# Patient Record
Sex: Male | Born: 1960 | Race: White | Hispanic: No | Smoking: Never smoker
Health system: Southern US, Community
[De-identification: ages and names within clinical notes are randomized; demographics above are authoritative.]

## PROBLEM LIST (undated history)

## (undated) DIAGNOSIS — T7840XA Allergy, unspecified, initial encounter: Secondary | ICD-10-CM

## (undated) DIAGNOSIS — Z86010 Personal history of colonic polyps: Secondary | ICD-10-CM

## (undated) DIAGNOSIS — I10 Essential (primary) hypertension: Secondary | ICD-10-CM

## (undated) DIAGNOSIS — B1910 Unspecified viral hepatitis B without hepatic coma: Secondary | ICD-10-CM

## (undated) DIAGNOSIS — E785 Hyperlipidemia, unspecified: Secondary | ICD-10-CM

## (undated) DIAGNOSIS — E079 Disorder of thyroid, unspecified: Secondary | ICD-10-CM

## (undated) HISTORY — DX: Essential (primary) hypertension: I10

## (undated) HISTORY — DX: Disorder of thyroid, unspecified: E07.9

## (undated) HISTORY — PX: POLYPECTOMY: SHX149

## (undated) HISTORY — DX: Hyperlipidemia, unspecified: E78.5

## (undated) HISTORY — DX: Personal history of colonic polyps: Z86.010

## (undated) HISTORY — DX: Unspecified viral hepatitis B without hepatic coma: B19.10

## (undated) HISTORY — PX: TONSILLECTOMY: SUR1361

## (undated) HISTORY — DX: Allergy, unspecified, initial encounter: T78.40XA

---
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
## 2012-03-17 DIAGNOSIS — Z86010 Personal history of colonic polyps: Secondary | ICD-10-CM

## 2012-03-17 DIAGNOSIS — Z860101 Personal history of adenomatous and serrated colon polyps: Secondary | ICD-10-CM

## 2012-03-17 HISTORY — PX: COLONOSCOPY: SHX174

## 2012-03-17 HISTORY — DX: Personal history of colonic polyps: Z86.010

## 2012-03-17 HISTORY — DX: Personal history of adenomatous and serrated colon polyps: Z86.0101

## 2017-01-06 ENCOUNTER — Encounter: Payer: Self-pay | Admitting: Family Medicine

## 2017-01-06 ENCOUNTER — Ambulatory Visit (INDEPENDENT_AMBULATORY_CARE_PROVIDER_SITE_OTHER): Payer: 59 | Admitting: Family Medicine

## 2017-01-06 VITALS — BP 128/88 | HR 63 | Temp 97.9°F | Ht 74.0 in | Wt 207.0 lb

## 2017-01-06 DIAGNOSIS — E785 Hyperlipidemia, unspecified: Secondary | ICD-10-CM | POA: Diagnosis not present

## 2017-01-06 DIAGNOSIS — Z23 Encounter for immunization: Secondary | ICD-10-CM | POA: Diagnosis not present

## 2017-01-06 NOTE — Patient Instructions (Signed)
WE NOW OFFER   [facility] FAST TRACK!!!  SAME DAY Appointments for ACUTE CARE  Such as: Sprains, Injuries, cuts, abrasions, rashes, muscle pain, joint pain, back pain Colds, flu, sore throats, headache, allergies, cough, fever  Ear pain, sinus and eye infections Abdominal pain, nausea, vomiting, diarrhea, upset stomach Animal/insect bites  3 Easy Ways to Schedule: Walk-In Scheduling Call in scheduling Mychart Sign-up: [email or web address]         

## 2017-01-06 NOTE — Addendum Note (Signed)
Addended by: Aggie Hacker A on: 01/06/2017 12:20 PM   Modules accepted: Orders

## 2017-01-06 NOTE — Progress Notes (Signed)
   Subjective:    Patient ID: Samuel Castillo, male    DOB: Mar 10, 1961, 56 y.o.   MRN: [id number]  HPI 56 yr old male to establish with [place]. He and his family moved to [place] in April from [place]. 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 He feels great and has no concerns. His last well exam was about a year and a half ago. He lives with his wife. Their 3 children are either in college or working on their own.    Review of Systems  Constitutional: Negative.   Respiratory: Negative.   Cardiovascular: Negative.   Gastrointestinal: Negative.   Genitourinary: Negative.   Neurological: Negative.        Objective:   Physical Exam  Constitutional: He appears well-developed and well-nourished.  Neck: No thyromegaly present.  Cardiovascular: Normal rate, regular rhythm, normal heart sounds and intact distal pulses.   Pulmonary/Chest: Effort normal and breath sounds normal. No respiratory distress. He has no wheezes. He has no rales.  Lymphadenopathy:    He has no cervical adenopathy.          Assessment & Plan:  Introductory visit for this healthy gentleman. He has a hx of borderline lipids and he watches his diet closely. He will return soon for a well exam and fasting labs.  Alysia Penna, MD

## 2017-12-04 ENCOUNTER — Encounter: Payer: Self-pay | Admitting: Family Medicine

## 2017-12-04 ENCOUNTER — Ambulatory Visit (INDEPENDENT_AMBULATORY_CARE_PROVIDER_SITE_OTHER): Payer: 59 | Admitting: Family Medicine

## 2017-12-04 VITALS — BP 120/92 | HR 71 | Temp 98.1°F | Ht 74.0 in | Wt 212.2 lb

## 2017-12-04 DIAGNOSIS — Z23 Encounter for immunization: Secondary | ICD-10-CM | POA: Diagnosis not present

## 2017-12-04 DIAGNOSIS — Z0000 Encounter for general adult medical examination without abnormal findings: Secondary | ICD-10-CM | POA: Diagnosis not present

## 2017-12-04 DIAGNOSIS — R739 Hyperglycemia, unspecified: Secondary | ICD-10-CM | POA: Diagnosis not present

## 2017-12-04 DIAGNOSIS — Z125 Encounter for screening for malignant neoplasm of prostate: Secondary | ICD-10-CM | POA: Diagnosis not present

## 2017-12-04 LAB — CBC WITH DIFFERENTIAL/PLATELET
BASOS ABS: 0 10*3/uL (ref 0.0–0.1)
BASOS PCT: 0.8 % (ref 0.0–3.0)
EOS ABS: 0.2 10*3/uL (ref 0.0–0.7)
Eosinophils Relative: 3.1 % (ref 0.0–5.0)
HEMATOCRIT: 44.9 % (ref 39.0–52.0)
HEMOGLOBIN: 15.8 g/dL (ref 13.0–17.0)
LYMPHS PCT: 19.3 % (ref 12.0–46.0)
Lymphs Abs: 1.2 10*3/uL (ref 0.7–4.0)
MCHC: 35.2 g/dL (ref 30.0–36.0)
MCV: 84.9 fl (ref 78.0–100.0)
MONOS PCT: 7.6 % (ref 3.0–12.0)
Monocytes Absolute: 0.5 10*3/uL (ref 0.1–1.0)
NEUTROS ABS: 4.3 10*3/uL (ref 1.4–7.7)
Neutrophils Relative %: 69.2 % (ref 43.0–77.0)
PLATELETS: 148 10*3/uL — AB (ref 150.0–400.0)
RBC: 5.29 Mil/uL (ref 4.22–5.81)
RDW: 12.9 % (ref 11.5–15.5)
WBC: 6.2 10*3/uL (ref 4.0–10.5)

## 2017-12-04 LAB — TSH: TSH: 4.62 u[IU]/mL — ABNORMAL HIGH (ref 0.35–4.50)

## 2017-12-04 LAB — HEPATIC FUNCTION PANEL
ALK PHOS: 44 U/L (ref 39–117)
ALT: 16 U/L (ref 0–53)
AST: 22 U/L (ref 0–37)
Albumin: 4.7 g/dL (ref 3.5–5.2)
BILIRUBIN DIRECT: 0.3 mg/dL (ref 0.0–0.3)
BILIRUBIN TOTAL: 1.6 mg/dL — AB (ref 0.2–1.2)
Total Protein: 7 g/dL (ref 6.0–8.3)

## 2017-12-04 LAB — POC URINALSYSI DIPSTICK (AUTOMATED)
BILIRUBIN UA: NEGATIVE
GLUCOSE UA: NEGATIVE
LEUKOCYTES UA: NEGATIVE
NITRITE UA: NEGATIVE
Protein, UA: NEGATIVE
RBC UA: NEGATIVE
Spec Grav, UA: 1.02 (ref 1.010–1.025)
UROBILINOGEN UA: 0.2 U/dL
pH, UA: 6 (ref 5.0–8.0)

## 2017-12-04 LAB — LIPID PANEL
CHOL/HDL RATIO: 5
Cholesterol: 232 mg/dL — ABNORMAL HIGH (ref 0–200)
HDL: 50.1 mg/dL (ref >39.00)
LDL CALC: 166 mg/dL — AB (ref 0–99)
NONHDL: 181.89
TRIGLYCERIDES: 78 mg/dL (ref 0.0–149.0)
VLDL: 15.6 mg/dL (ref 0.0–40.0)

## 2017-12-04 LAB — BASIC METABOLIC PANEL
BUN: 19 mg/dL (ref 6–23)
CHLORIDE: 99 meq/L (ref 96–112)
CO2: 33 mEq/L — ABNORMAL HIGH (ref 19–32)
Calcium: 9.8 mg/dL (ref 8.4–10.5)
Creatinine, Ser: 1.16 mg/dL (ref 0.40–1.50)
GFR: 68.41 mL/min (ref >60.00)
Glucose, Bld: 85 mg/dL (ref 70–99)
POTASSIUM: 3.7 meq/L (ref 3.5–5.1)
SODIUM: 138 meq/L (ref 135–145)

## 2017-12-04 LAB — HEMOGLOBIN A1C: Hgb A1c MFr Bld: 5.5 % (ref 4.6–6.5)

## 2017-12-04 LAB — PSA: PSA: 0.67 ng/mL (ref 0.10–4.00)

## 2017-12-04 MED ORDER — TERBINAFINE HCL 250 MG PO TABS: 250.0000 mg | ORAL_TABLET | Freq: Every day | ORAL | 1 refills | Status: DC

## 2017-12-04 NOTE — Progress Notes (Signed)
   Subjective:    Patient ID: Samuel Castillo, male    DOB: 07-15-1960, 57 y.o.   MRN: [id number]  HPI Here for a well exam. He feels great. He works out at [employer] 3 days a week. He does ask about toenail fungus. This appeared several years ago and OTC creams do not help.    Review of Systems  Constitutional: Negative.   HENT: Negative.   Eyes: Negative.   Respiratory: Negative.   Cardiovascular: Negative.   Gastrointestinal: Negative.   Genitourinary: Negative.   Musculoskeletal: Negative.   Skin: Negative.   Neurological: Negative.   Psychiatric/Behavioral: Negative.        Objective:   Physical Exam  Constitutional: He is oriented to person, place, and time. He appears well-developed and well-nourished. No distress.  HENT:  Head: Normocephalic and atraumatic.  Right Ear: External ear normal.  Left Ear: External ear normal.  Nose: Nose normal.  Mouth/Throat: Oropharynx is clear and moist. No oropharyngeal exudate.  Eyes: Pupils are equal, round, and reactive to light. Conjunctivae and EOM are normal. Right eye exhibits no discharge. Left eye exhibits no discharge. No scleral icterus.  Neck: Neck supple. No JVD present. No tracheal deviation present. No thyromegaly present.  Cardiovascular: Normal rate, regular rhythm, normal heart sounds and intact distal pulses. Exam reveals no gallop and no friction rub.  No murmur heard. Pulmonary/Chest: Effort normal and breath sounds normal. No respiratory distress. He has no wheezes. He has no rales. He exhibits no tenderness.  Abdominal: Soft. Bowel sounds are normal. He exhibits no distension and no mass. There is no tenderness. There is no rebound and no guarding.  Genitourinary: Rectum normal, prostate normal and penis normal. Rectal exam shows guaiac negative stool. No penile tenderness.  Musculoskeletal: Normal range of motion. He exhibits no edema or tenderness.  Lymphadenopathy:    He has no cervical adenopathy.  Neurological:  He is alert and oriented to person, place, and time. He has normal reflexes. He displays normal reflexes. No cranial nerve deficit. He exhibits normal muscle tone. Coordination normal.  Skin: Skin is warm and dry. No rash noted. He is not diaphoretic. No erythema. No pallor.  8 out of 10 toenails show fungal involvement   Psychiatric: He has a normal mood and affect. His behavior is normal. Judgment and thought content normal.          Assessment & Plan:  Well exam. We discussed diet and exercise. Get fasting labs. Treat the onychomycosis with Terbinafine. Set up a colonoscopy.  Alysia Penna, MD

## 2017-12-17 ENCOUNTER — Encounter: Payer: Self-pay | Admitting: Family Medicine

## 2017-12-18 NOTE — Telephone Encounter (Signed)
FYI for Dr. Sarajane Jews.  Please advise. Thanks

## 2017-12-21 NOTE — Telephone Encounter (Signed)
Noted  

## 2018-02-15 ENCOUNTER — Encounter: Payer: Self-pay | Admitting: Family Medicine

## 2018-02-25 ENCOUNTER — Telehealth: Payer: Self-pay | Admitting: Gastroenterology

## 2018-02-25 NOTE — Telephone Encounter (Signed)
Patient was referred for colonoscopy but had colon 5 yrs ago please review and advised. Colon report and path avail.  Placed on Dr [employer] desk

## 2018-03-19 ENCOUNTER — Encounter: Payer: Self-pay | Admitting: Gastroenterology

## 2018-03-19 NOTE — Telephone Encounter (Signed)
Records reviewed and Dr. Rush Landmark agrees with scheduling Direct Colon now. I have called Samuel Castillo name pronouced "Check" per pt. We scheduled his procedure for 04-15-2017.

## 2018-04-02 ENCOUNTER — Encounter: Payer: Self-pay | Admitting: Gastroenterology

## 2018-04-02 ENCOUNTER — Ambulatory Visit (AMBULATORY_SURGERY_CENTER): Payer: Self-pay

## 2018-04-02 VITALS — Ht 73.5 in | Wt 212.8 lb

## 2018-04-02 DIAGNOSIS — Z860101 Personal history of adenomatous and serrated colon polyps: Secondary | ICD-10-CM

## 2018-04-02 DIAGNOSIS — Z86010 Personal history of colonic polyps: Secondary | ICD-10-CM

## 2018-04-02 MED ORDER — PEG 3350-KCL-NA BICARB-NACL 420 G PO SOLR: 4000.0000 mL | Freq: Once | ORAL | 0 refills | Status: AC

## 2018-04-02 NOTE — Progress Notes (Signed)
Per pt, no allergies to soy or egg products.Pt not taking any weight loss meds or using  O2 at home.  Pt refused emmi video. 

## 2018-04-15 ENCOUNTER — Encounter: Payer: Self-pay | Admitting: Gastroenterology

## 2018-04-15 ENCOUNTER — Ambulatory Visit (AMBULATORY_SURGERY_CENTER): Payer: 59 | Admitting: Gastroenterology

## 2018-04-15 VITALS — BP 111/68 | HR 60 | Temp 98.6°F | Resp 10 | Ht 73.0 in | Wt 212.0 lb

## 2018-04-15 DIAGNOSIS — Z860100 Personal history of colon polyps, unspecified: Secondary | ICD-10-CM

## 2018-04-15 DIAGNOSIS — D122 Benign neoplasm of ascending colon: Secondary | ICD-10-CM

## 2018-04-15 DIAGNOSIS — D123 Benign neoplasm of transverse colon: Secondary | ICD-10-CM

## 2018-04-15 DIAGNOSIS — D125 Benign neoplasm of sigmoid colon: Secondary | ICD-10-CM | POA: Diagnosis not present

## 2018-04-15 HISTORY — PX: COLONOSCOPY: SHX174

## 2018-04-15 MED ORDER — SODIUM CHLORIDE 0.9 % IV SOLN: 500.0000 mL | Freq: Once | INTRAVENOUS | Status: DC

## 2018-04-15 NOTE — Patient Instructions (Addendum)
Handouts Provided:  High Fiber Diet and Polyps  YOU HAD AN ENDOSCOPIC PROCEDURE TODAY AT [facility]:   Refer to the procedure report that was given to you for any specific questions about what was found during the examination.  If the procedure report does not answer your questions, please call your gastroenterologist to clarify.  If you requested that your care partner not be given the details of your procedure findings, then the procedure report has been included in a sealed envelope for you to review at your convenience later.  YOU SHOULD EXPECT: Some feelings of bloating in the abdomen. Passage of more gas than usual.  Walking can help get rid of the air that was put into your GI tract during the procedure and reduce the bloating. If you had a lower endoscopy (such as a colonoscopy or flexible sigmoidoscopy) you may notice spotting of blood in your stool or on the toilet paper. If you underwent a bowel prep for your procedure, you may not have a normal bowel movement for a few days.  Please Note:  You might notice some irritation and congestion in your nose or some drainage.  This is from the oxygen used during your procedure.  There is no need for concern and it should clear up in a day or so.  SYMPTOMS TO REPORT IMMEDIATELY:   Following lower endoscopy (colonoscopy or flexible sigmoidoscopy):  Excessive amounts of blood in the stool  Significant tenderness or worsening of abdominal pains  Swelling of the abdomen that is new, acute  Fever of 100F or higher  For urgent or emergent issues, a gastroenterologist can be reached at any hour by calling [phone].   DIET:  We do recommend a small meal at first, but then you may proceed to your regular diet.  Drink plenty of fluids but you should avoid alcoholic beverages for 24 hours.  ACTIVITY:  You should plan to take it easy for the rest of today and you should NOT DRIVE or use heavy machinery until tomorrow (because of  the sedation medicines used during the test).    FOLLOW UP: Our staff will call the number listed on your records the next business day following your procedure to check on you and address any questions or concerns that you may have regarding the information given to you following your procedure. If we do not reach you, we will leave a message.  However, if you are feeling well and you are not experiencing any problems, there is no need to return our call.  We will assume that you have returned to your regular daily activities without incident.  If any biopsies were taken you will be contacted by phone or by letter within the next 1-3 weeks.  Please call us at [phone] if you have not heard about the biopsies in 3 weeks.    SIGNATURES/CONFIDENTIALITY: You and/or your care partner have signed paperwork which will be entered into your electronic medical record.  These signatures attest to the fact that that the information above on your After Visit Summary has been reviewed and is understood.  Full responsibility of the confidentiality of this discharge information lies with you and/or your care-partner.

## 2018-04-15 NOTE — Progress Notes (Signed)
Pt's states no medical or surgical changes since previsit or office visit. 

## 2018-04-15 NOTE — Op Note (Signed)
[facility] Patient Name: Samuel Castillo Procedure Date: 04/15/2018 8:36 AM MRN: [id number] Endoscopist: Gabriel Mansouraty , MD Age: 58 Referring MD:  Date of Birth: 05/14/1960 Gender: Male Account #: [id number] Procedure:                Colonoscopy Indications:              Surveillance: Personal history of adenomatous                            polyps on last colonoscopy > 5 years ago Medicines:                Monitored Anesthesia Care Procedure:                Pre-Anesthesia Assessment:                           - Prior to the procedure, a History and Physical                            was performed, and patient medications and                            allergies were reviewed. The patient's tolerance of                            previous anesthesia was also reviewed. The risks                            and benefits of the procedure and the sedation                            options and risks were discussed with the patient.                            All questions were answered, and informed consent                            was obtained. Prior Anticoagulants: The patient has                            taken no previous anticoagulant or antiplatelet                            agents. ASA Grade Assessment: II - A patient with                            mild systemic disease. After reviewing the risks                            and benefits, the patient was deemed in                            satisfactory condition to undergo the procedure.                             After obtaining informed consent, the colonoscope                            was passed under direct vision. Throughout the                            procedure, the patient's blood pressure, pulse, and                            oxygen saturations were monitored continuously. The                            Colonoscope was introduced through the anus and                            advanced to the the cecum,  identified by palpation.                            The colonoscopy was performed without difficulty.                            The patient tolerated the procedure. The quality of                            the bowel preparation was excellent. The terminal                            ileum, ileocecal valve, appendiceal orifice, and                            rectum were photographed. Scope In: 8:45:21 AM Scope Out: 9:01:51 AM Scope Withdrawal Time: 0 hours 12 minutes 26 seconds  Total Procedure Duration: 0 hours 16 minutes 30 seconds  Findings:                 The digital rectal exam findings include                            non-thrombosed external hemorrhoids and                            non-thrombosed internal hemorrhoids. Pertinent                            negatives include no palpable rectal lesions.                           The terminal ileum and ileocecal valve appeared                            normal.                           Three sessile polyps were found in the sigmoid                              colon, transverse colon and ascending colon. The                            polyps were 2 to 5 mm in size. These polyps were                            removed with a cold snare. Resection and retrieval                            were complete.                           Normal mucosa was found in the entire colon                            otherwise.                           Non-bleeding non-thrombosed external and internal                            hemorrhoids were found during retroflexion, during                            perianal exam and during digital exam. The                            hemorrhoids were Grade II (internal hemorrhoids                            that prolapse but reduce spontaneously). Complications:            No immediate complications. Estimated Blood Loss:     Estimated blood loss was minimal. Impression:               - Non-thrombosed external  hemorrhoids and                            non-thrombosed internal hemorrhoids found on                            digital rectal exam.                           - The examined portion of the ileum was normal.                           - Three 2 to 5 mm polyps in the sigmoid colon, in                            the transverse colon and in the ascending colon,                            removed with a cold snare. Resected and retrieved.                           -  Normal mucosa in the entire examined colon                            otherwise.                           - Non-bleeding non-thrombosed external and internal                            hemorrhoids. Recommendation:           - The patient will be observed post-procedure,                            until all discharge criteria are met.                           - Discharge patient to home.                           - Patient has a contact number available for                            emergencies. The signs and symptoms of potential                            delayed complications were discussed with the                            patient. Return to normal activities tomorrow.                            Written discharge instructions were provided to the                            patient.                           - High fiber diet.                           - Use fiber, for example Citrucel, Fibercon, Konsyl                            or Metamucil.                           - Continue present medications.                           - Await pathology results.                           - Repeat colonoscopy in 3 years for surveillance                            based on pathology results.                           -  The findings and recommendations were discussed                            with the patient.                           - The findings and recommendations were discussed                            with the patient's  family. [name], MD 04/15/2018 9:08:56 AM

## 2018-04-15 NOTE — Progress Notes (Signed)
PT taken to PACU. Monitors in place. VSS. Report given to RN. 

## 2018-04-15 NOTE — Progress Notes (Signed)
Called to room to assist during endoscopic procedure.  Patient ID and intended procedure confirmed with present staff. Received instructions for my participation in the procedure from the performing physician.  

## 2018-04-16 ENCOUNTER — Telehealth: Payer: Self-pay | Admitting: *Deleted

## 2018-04-16 NOTE — Telephone Encounter (Signed)
  Follow up Call-  Call back number 04/15/2018  Post procedure Call Back phone  # [phone]  Permission to leave phone message Yes  Some recent data might be hidden     Patient questions:  Do you have a fever, pain , or abdominal swelling? No. Pain Score  0 *  Have you tolerated food without any problems? Yes.    Have you been able to return to your normal activities? Yes.    Do you have any questions about your discharge instructions: Diet   No. Medications  No. Follow up visit  No.  Do you have questions or concerns about your Care? No.  Actions: * If pain score is 4 or above: No action needed, pain <4.

## 2018-04-20 ENCOUNTER — Encounter: Payer: Self-pay | Admitting: Gastroenterology

## 2018-09-21 NOTE — Progress Notes (Signed)
[facility] [address], [place] [address] Phone: [phone] Subjective:    I'm seeing this patient by the request  of:  Laurey Morale, MD   CC: neck pain   DXA:JOINOMVEHM  Samuel Castillo is a 58 y.o. male coming in with complaint of neck pain.  Patient has had this for approximately 5 to 6 weeks.  Does not remember any true injury.  Has been sitting on his desk little bit more.  Mild intermittent radiation down the arm.  Patient denies however to the hand.  Patient states that now the pain in the neck seems to be more constant.  Dull aching sensation sharp pain with certain movements.  Patient rates the severity of pain a 5 out of 10.  Has been taking anti-inflammatories nightly to help him sleep.     Past Medical History:  Diagnosis Date  . Hepatitis B infection    resolved, no viral remnants   . Hx of adenomatous colonic polyps 2014  . Hyperlipidemia    Past Surgical History:  Procedure Laterality Date  . COLONOSCOPY  2014   done in [place], adenomatous polyps, reepat in 5 yrs   . POLYPECTOMY    . TONSILLECTOMY  1970  . TONSILLECTOMY  1970's   Social History   Socioeconomic History  . Marital status: Married    Spouse name: Not on file  . Number of children: Not on file  . Years of education: Not on file  . Highest education level: Not on file  Occupational History  . Not on file  Social Needs  . Financial resource strain: Not on file  . Food insecurity    Worry: Not on file    Inability: Not on file  . Transportation needs    Medical: Not on file    Non-medical: Not on file  Tobacco Use  . Smoking status: Never Smoker  . Smokeless tobacco: Never Used  Substance and Sexual Activity  . Alcohol use: Yes    Comment: social  . Drug use: Not Currently  . Sexual activity: Not on file  Lifestyle  . Physical activity    Days per week: Not on file    Minutes per session: Not on file  . Stress: Not on file   Relationships  . Social Herbalist on phone: Not on file    Gets together: Not on file    Attends religious service: Not on file    Active member of club or organization: Not on file    Attends meetings of clubs or organizations: Not on file    Relationship status: Not on file  Other Topics Concern  . Not on file  Social History Narrative  . Not on file   No Known Allergies Family History  Problem Relation Age of Onset  . Breast cancer Mother   . Diabetes Father   . Heart disease Father   . Leukemia Sister   . Healthy Sister   . Healthy Sister   . Healthy Brother      Current Outpatient Medications (Cardiovascular):  .  sildenafil (REVATIO) 20 MG tablet, Take 20 mg by mouth as needed.   Current Outpatient Medications (Analgesics):  .  meloxicam (MOBIC) 7.5 MG tablet, Take 1 tablet (7.5 mg total) by mouth daily.   Current Outpatient Medications (Other):  .  gabapentin (NEURONTIN) 100 MG capsule, Take 2 capsules (200 mg total) by mouth at bedtime.  Past medical history, social, surgical and family history all reviewed in electronic medical record.  No pertanent information unless stated regarding to the chief complaint.   Review of Systems:  No headache, visual changes, nausea, vomiting, diarrhea, constipation, dizziness, abdominal pain, skin rash, fevers, chills, night sweats, weight loss, swollen lymph nodes, body aches, joint swelling, chest pain, shortness of breath, mood changes.  Positive muscle aches  Objective    General: No apparent distress alert and oriented x3 mood and affect normal, dressed appropriately.  HEENT: Pupils equal, extraocular movements intact  Respiratory: Patient's speak in full sentences and does not appear short of breath  Cardiovascular: No lower extremity edema, non tender, no erythema  Skin: Warm dry intact with no signs of infection or rash on extremities or on axial skeleton.  Abdomen: Soft nontender  Neuro: Cranial  nerves II through XII are intact, neurovascularly intact in all extremities with 2+ DTRs and 2+ pulses.  Lymph: No lymphadenopathy of posterior or anterior cervical chain or axillae bilaterally.  Gait normal with good balance and coordination.  MSK:  Non tender with full range of motion and good stability and symmetric strength and tone of shoulders, elbows, wrist, hip, knee and ankles bilaterally.  Neck: Inspection mild loss of lordosis. No palpable stepoffs. Negative Spurling's maneuver.  Severe tenderness though noted with it F motion with sidebending bilaterally right greater than left. Grip strength and sensation normal in bilateral hands Strength good C4 to T1 distribution No sensory change to C4 to T1 Negative Hoffman sign bilaterally Reflexes normal Significant tightness of the right paraspinal musculature in the medial aspect of the scapula.  Osteopathic findings C2 flexed rotated and side bent right T3 extended rotated and side bent right inhaled third rib  [id number]; 15 additional minutes spent for Therapeutic exercises as stated in above notes.  This included exercises focusing on stretching, strengthening, with significant focus on eccentric aspects.   Long term goals include an improvement in range of motion, strength, endurance as well as avoiding reinjury. Patient's frequency would include in 1-2 times a day, 3-5 times a week for a duration of 6-12 weeks.  Proper technique shown and discussed handout in great detail with ATC.  All questions were discussed and answered.     Impression and Recommendations:     This case required medical decision making of moderate complexity. The above documentation has been reviewed and is accurate and complete Lyndal Pulley, DO       Note: This dictation was prepared with Dragon dictation along with smaller phrase technology. Any transcriptional errors that result from this process are unintentional.

## 2018-09-22 ENCOUNTER — Ambulatory Visit: Payer: 59 | Admitting: Family Medicine

## 2018-09-22 ENCOUNTER — Encounter: Payer: Self-pay | Admitting: Family Medicine

## 2018-09-22 ENCOUNTER — Ambulatory Visit (INDEPENDENT_AMBULATORY_CARE_PROVIDER_SITE_OTHER)
Admission: RE | Admit: 2018-09-22 | Discharge: 2018-09-22 | Disposition: A | Discharge status: Home / Self Care | Payer: 59 | Source: Ambulatory Visit | Attending: Family Medicine | Admitting: Family Medicine

## 2018-09-22 ENCOUNTER — Other Ambulatory Visit: Payer: Self-pay

## 2018-09-22 DIAGNOSIS — M999 Biomechanical lesion, unspecified: Secondary | ICD-10-CM | POA: Diagnosis not present

## 2018-09-22 DIAGNOSIS — M542 Cervicalgia: Secondary | ICD-10-CM | POA: Diagnosis not present

## 2018-09-22 MED ORDER — GABAPENTIN 100 MG PO CAPS: 200.0000 mg | ORAL_CAPSULE | Freq: Every day | ORAL | 0 refills | Status: DC

## 2018-09-22 MED ORDER — MELOXICAM 7.5 MG PO TABS: 7.5000 mg | ORAL_TABLET | Freq: Every day | ORAL | 0 refills | Status: DC

## 2018-09-22 NOTE — Assessment & Plan Note (Signed)
X-rays pending.  Seems to be more muscular in nature.  Do believe there will be some underlying arthritis but mild to moderate in nature.  Gabapentin given for some of the radicular symptoms as well as at the elbow.  Meloxicam given and take daily for the next 10 days then as needed.  Discussed posture and ergonomics.  Follow-up again in 4 to 8 weeks

## 2018-09-22 NOTE — Assessment & Plan Note (Signed)
Decision today to treat with OMT was based on Physical Exam  After verbal consent patient was treated with , ME, techniques in cervical areas  Patient tolerated the procedure well with improvement in symptoms  Patient given exercises, stretches and lifestyle modifications  See medications in patient instructions if given  Patient will follow up in 4 weeks

## 2018-09-22 NOTE — Patient Instructions (Addendum)
Good to see you.  Ice 20 minutes 2 times daily. Usually after activity and before bed. Exercises 3 times a week.  Gabapentin 200 mg at night .  Meloxicam daily for 10 days then as needed Monitor at eye level Tennis ball between shoulder blades See me again in 4-6 weeks

## 2018-10-19 ENCOUNTER — Ambulatory Visit: Payer: 59 | Admitting: Family Medicine

## 2018-11-02 ENCOUNTER — Encounter: Payer: Self-pay | Admitting: Family Medicine

## 2018-11-04 MED ORDER — SILDENAFIL CITRATE 20 MG PO TABS: ORAL_TABLET | ORAL | 11 refills | Status: DC

## 2018-11-04 NOTE — Telephone Encounter (Signed)
Call in Sildenafil 20 mg to take 5 tabs as needed, #50 with 11 rf

## 2018-11-26 ENCOUNTER — Encounter: Payer: Self-pay | Admitting: Family Medicine

## 2018-12-14 ENCOUNTER — Other Ambulatory Visit: Payer: Self-pay

## 2018-12-14 ENCOUNTER — Ambulatory Visit (INDEPENDENT_AMBULATORY_CARE_PROVIDER_SITE_OTHER): Payer: 59

## 2018-12-14 DIAGNOSIS — Z23 Encounter for immunization: Secondary | ICD-10-CM

## 2018-12-14 NOTE — Progress Notes (Signed)
Patient in clinic for Shingrix and flu. Both vaccines administered with no reaction.

## 2019-02-07 ENCOUNTER — Other Ambulatory Visit: Payer: Self-pay

## 2019-02-07 ENCOUNTER — Ambulatory Visit (INDEPENDENT_AMBULATORY_CARE_PROVIDER_SITE_OTHER): Payer: 59 | Admitting: *Deleted

## 2019-02-07 DIAGNOSIS — Z23 Encounter for immunization: Secondary | ICD-10-CM | POA: Diagnosis not present

## 2019-02-07 NOTE — Progress Notes (Signed)
Patient in for Shingles vaccine dose 2. Vaccine administered with no reactions.

## 2019-02-14 ENCOUNTER — Ambulatory Visit: Payer: 59

## 2019-02-17 ENCOUNTER — Other Ambulatory Visit: Payer: Self-pay

## 2019-02-17 ENCOUNTER — Encounter: Payer: Self-pay | Admitting: Family Medicine

## 2019-02-17 ENCOUNTER — Ambulatory Visit (INDEPENDENT_AMBULATORY_CARE_PROVIDER_SITE_OTHER): Payer: 59 | Admitting: Family Medicine

## 2019-02-17 VITALS — BP 124/72 | HR 90 | Temp 97.6°F | Ht 74.0 in | Wt 207.6 lb

## 2019-02-17 DIAGNOSIS — L989 Disorder of the skin and subcutaneous tissue, unspecified: Secondary | ICD-10-CM

## 2019-02-17 DIAGNOSIS — Z0000 Encounter for general adult medical examination without abnormal findings: Secondary | ICD-10-CM

## 2019-02-17 LAB — BASIC METABOLIC PANEL
BUN: 25 mg/dL — ABNORMAL HIGH (ref 6–23)
CO2: 31 mEq/L (ref 19–32)
Calcium: 9.9 mg/dL (ref 8.4–10.5)
Chloride: 99 mEq/L (ref 96–112)
Creatinine, Ser: 1.22 mg/dL (ref 0.40–1.50)
GFR: 60.48 mL/min (ref >60.00)
Glucose, Bld: 95 mg/dL (ref 70–99)
Potassium: 4.4 mEq/L (ref 3.5–5.1)
Sodium: 139 mEq/L (ref 135–145)

## 2019-02-17 LAB — HEPATIC FUNCTION PANEL
ALT: 15 U/L (ref 0–53)
AST: 21 U/L (ref 0–37)
Albumin: 4.8 g/dL (ref 3.5–5.2)
Alkaline Phosphatase: 49 U/L (ref 39–117)
Bilirubin, Direct: 0.2 mg/dL (ref 0.0–0.3)
Total Bilirubin: 1.2 mg/dL (ref 0.2–1.2)
Total Protein: 7.1 g/dL (ref 6.0–8.3)

## 2019-02-17 LAB — LIPID PANEL
Cholesterol: 250 mg/dL — ABNORMAL HIGH (ref 0–200)
HDL: 53 mg/dL (ref >39.00)
LDL Cholesterol: 183 mg/dL — ABNORMAL HIGH (ref 0–99)
NonHDL: 196.58
Total CHOL/HDL Ratio: 5
Triglycerides: 69 mg/dL (ref 0.0–149.0)
VLDL: 13.8 mg/dL (ref 0.0–40.0)

## 2019-02-17 LAB — CBC WITH DIFFERENTIAL/PLATELET
Basophils Absolute: 0.1 10*3/uL (ref 0.0–0.1)
Basophils Relative: 1.1 % (ref 0.0–3.0)
Eosinophils Absolute: 0.2 10*3/uL (ref 0.0–0.7)
Eosinophils Relative: 2.8 % (ref 0.0–5.0)
HCT: 46.4 % (ref 39.0–52.0)
Hemoglobin: 16.2 g/dL (ref 13.0–17.0)
Lymphocytes Relative: 20.2 % (ref 12.0–46.0)
Lymphs Abs: 1.2 10*3/uL (ref 0.7–4.0)
MCHC: 34.9 g/dL (ref 30.0–36.0)
MCV: 85.4 fl (ref 78.0–100.0)
Monocytes Absolute: 0.4 10*3/uL (ref 0.1–1.0)
Monocytes Relative: 6.6 % (ref 3.0–12.0)
Neutro Abs: 4.2 10*3/uL (ref 1.4–7.7)
Neutrophils Relative %: 69.3 % (ref 43.0–77.0)
Platelets: 177 10*3/uL (ref 150.0–400.0)
RBC: 5.44 Mil/uL (ref 4.22–5.81)
RDW: 12.3 % (ref 11.5–15.5)
WBC: 6 10*3/uL (ref 4.0–10.5)

## 2019-02-17 LAB — TSH: TSH: 5.74 u[IU]/mL — ABNORMAL HIGH (ref 0.35–4.50)

## 2019-02-17 LAB — PSA: PSA: 0.6 ng/mL (ref 0.10–4.00)

## 2019-02-17 NOTE — Progress Notes (Signed)
Subjective:    Patient ID: Samuel Castillo, male    DOB: 05/02/1960, 58 y.o.   MRN: [id number]  HPI Here for a well exam. He has a few issues to discuss. First about 8 weeks ago a lesion appeared on the right pinky that he thought was a wart. He has been applying Compound W daily but this is not working. This is not painful. Second he has had an intermittent pain in the right elbow for about a year. No hx of trauma. Third he has had an intermittent stiffness and pain in the right neck for about a year. No trauma hx. He saw Dr. Charlann Boxer and had neck Xrays that were unremarkable. He tried Meloxicam and Gabapentin with no relief. Now he takes Ibuprofen at times. He spends all his time at work on his computer.    Review of Systems  Constitutional: Negative.   HENT: Negative.   Eyes: Negative.   Respiratory: Negative.   Cardiovascular: Negative.   Gastrointestinal: Negative.   Genitourinary: Negative.   Musculoskeletal: Positive for arthralgias.  Skin: Negative.   Neurological: Negative.   Psychiatric/Behavioral: Negative.        Objective:   Physical Exam Constitutional:      General: He is not in acute distress.    Appearance: He is well-developed. He is not diaphoretic.  HENT:     Head: Normocephalic and atraumatic.     Right Ear: External ear normal.     Left Ear: External ear normal.     Nose: Nose normal.     Mouth/Throat:     Pharynx: No oropharyngeal exudate.  Eyes:     General: No scleral icterus.       Right eye: No discharge.        Left eye: No discharge.     Conjunctiva/sclera: Conjunctivae normal.     Pupils: Pupils are equal, round, and reactive to light.  Neck:     Musculoskeletal: Neck supple.     Thyroid: No thyromegaly.     Vascular: No JVD.     Trachea: No tracheal deviation.  Cardiovascular:     Rate and Rhythm: Normal rate and regular rhythm.     Heart sounds: Normal heart sounds. No murmur. No friction rub. No gallop.   Pulmonary:     Effort:  Pulmonary effort is normal. No respiratory distress.     Breath sounds: Normal breath sounds. No wheezing or rales.  Chest:     Chest wall: No tenderness.  Abdominal:     General: Bowel sounds are normal. There is no distension.     Palpations: Abdomen is soft. There is no mass.     Tenderness: There is no abdominal tenderness. There is no guarding or rebound.  Genitourinary:    Penis: Normal. No tenderness.      Prostate: Normal.     Rectum: Normal. Guaiac result negative.  Musculoskeletal: Normal range of motion.     Comments: His neck is normal with full ROM and no tenderness. He is tender over the right medial epicondyle with full ROM and no swelling   Lymphadenopathy:     Cervical: No cervical adenopathy.  Skin:    General: Skin is warm and dry.     Coloration: Skin is not pale.     Findings: No erythema or rash.     Comments: The right 5th finger has a firm non-tender papular lesion over the dorsal DIP. This does not appear to be a ganglion  cyst, nor does it look like a wart   Neurological:     Mental Status: He is alert and oriented to person, place, and time.     Cranial Nerves: No cranial nerve deficit.     Motor: No abnormal muscle tone.     Coordination: Coordination normal.     Deep Tendon Reflexes: Reflexes are normal and symmetric. Reflexes normal.  Psychiatric:        Behavior: Behavior normal.        Thought Content: Thought content normal.        Judgment: Judgment normal.           Assessment & Plan:  Well exam. We discussed diet and exercise. Get fasting labs. For the neck pain, this is likely the result of spending so much time on the computer. He can use heat and Ibuprofen prn. I asked him to make some work space adjustments such as changing the height of his chair to make this more ergonomic. He has medial epicondylitis and he can use rest and ice packs for this. For the finger lesion, we will refer him to the [facility] for excision. Alysia Penna, MD

## 2019-02-18 MED ORDER — LEVOTHYROXINE SODIUM 75 MCG PO TABS: 75.0000 ug | ORAL_TABLET | Freq: Every day | ORAL | 3 refills | Status: DC

## 2019-02-18 NOTE — Addendum Note (Signed)
Addended by: Gwenyth Ober R on: 02/18/2019 06:38 PM   Modules accepted: Orders

## 2019-02-22 ENCOUNTER — Other Ambulatory Visit: Payer: Self-pay | Admitting: *Deleted

## 2019-02-22 DIAGNOSIS — E785 Hyperlipidemia, unspecified: Secondary | ICD-10-CM

## 2019-02-22 DIAGNOSIS — R7989 Other specified abnormal findings of blood chemistry: Secondary | ICD-10-CM

## 2019-02-24 ENCOUNTER — Other Ambulatory Visit: Payer: Self-pay

## 2019-02-24 ENCOUNTER — Telehealth (INDEPENDENT_AMBULATORY_CARE_PROVIDER_SITE_OTHER): Payer: 59 | Admitting: Family Medicine

## 2019-02-24 ENCOUNTER — Encounter: Payer: Self-pay | Admitting: Family Medicine

## 2019-02-24 DIAGNOSIS — Z20828 Contact with and (suspected) exposure to other viral communicable diseases: Secondary | ICD-10-CM | POA: Diagnosis not present

## 2019-02-24 DIAGNOSIS — H9201 Otalgia, right ear: Secondary | ICD-10-CM | POA: Diagnosis not present

## 2019-02-24 DIAGNOSIS — Z20822 Contact with and (suspected) exposure to covid-19: Secondary | ICD-10-CM

## 2019-02-24 DIAGNOSIS — Z209 Contact with and (suspected) exposure to unspecified communicable disease: Secondary | ICD-10-CM

## 2019-02-24 MED ORDER — CIPROFLOXACIN-DEXAMETHASONE 0.3-0.1 % OT SUSP: 4.0000 [drp] | Freq: Two times a day (BID) | OTIC | 0 refills | Status: DC

## 2019-02-24 NOTE — Progress Notes (Signed)
Virtual Visit via Video Note  I connected with the patient on 02/24/19 at 11:15 AM EST by a video enabled telemedicine application and verified that I am speaking with the correct person using two identifiers.  Location patient: home Location provider:work or home office Persons participating in the virtual visit: patient, provider  I discussed the limitations of evaluation and management by telemedicine and the availability of in person appointments. The patient expressed understanding and agreed to proceed.   HPI: Here for several issues. First he has had some tenderness to touch over the right ear for 2 days. It hurts to wiggle the ear lobe or to lay it on a pillow. No headache or sinus congestion. No fever or ST or cough or SOB. No body aches or NVD. No loss of taste or smell. Also he asks if he should get a Covid-19 test. Last weekend he had a meal with some family members, and yesterday he learned that his son-in-law (who was at the meal) has tested positive for the Covid virus. Jim's wife and daughter have already been tested and are awaiting results.    ROS: See pertinent positives and negatives per HPI.  Past Medical History:  Diagnosis Date  . Hepatitis B infection    resolved, no viral remnants   . Hx of adenomatous colonic polyps 2014  . Hyperlipidemia     Past Surgical History:  Procedure Laterality Date  . COLONOSCOPY  04/15/2018   per Dr. Rush Landmark, tubular adenomas, repeat in 3 yrs   . TONSILLECTOMY  1970    Family History  Problem Relation Age of Onset  . Breast cancer Mother   . Diabetes Father   . Heart disease Father   . Leukemia Sister   . Healthy Sister   . Healthy Sister   . Healthy Brother      Current Outpatient Medications:  .  levothyroxine (SYNTHROID) 75 MCG tablet, Take 1 tablet (75 mcg total) by mouth daily., Disp: 90 tablet, Rfl: 3 .  sildenafil (REVATIO) 20 MG tablet, Take up to 5 tablets a day as needed., Disp: 50 tablet, Rfl: 11 .   ciprofloxacin-dexamethasone (CIPRODEX) OTIC suspension, Place 4 drops into the right ear 2 (two) times daily., Disp: 7.5 mL, Rfl: 0  EXAM:  VITALS per patient if applicable:  GENERAL: alert, oriented, appears well and in no acute distress  HEENT: atraumatic, conjunttiva clear, no obvious abnormalities on inspection of external nose and ears  NECK: normal movements of the head and neck  LUNGS: on inspection no signs of respiratory distress, breathing rate appears normal, no obvious gross SOB, gasping or wheezing  CV: no obvious cyanosis  MS: moves all visible extremities without noticeable abnormality  PSYCH/NEURO: pleasant and cooperative, no obvious depression or anxiety, speech and thought processing grossly intact  ASSESSMENT AND PLAN: He seems to have an otitis externa, so we will treat with Ciprodex drops. Otherwise he had a definite close exposure to someone with the Covid virus, so I advised him to be tested himself today. He agreed.  Alysia Penna, MD    Discussed the following assessment and plan:  No diagnosis found.     I discussed the assessment and treatment plan with the patient. The patient was provided an opportunity to ask questions and all were answered. The patient agreed with the plan and demonstrated an understanding of the instructions.   The patient was advised to call back or seek an in-person evaluation if the symptoms worsen or if the condition  fails to improve as anticipated.

## 2019-02-26 LAB — NOVEL CORONAVIRUS, NAA: SARS-CoV-2, NAA: NOT DETECTED

## 2019-05-20 ENCOUNTER — Other Ambulatory Visit: Payer: Self-pay

## 2019-05-23 ENCOUNTER — Other Ambulatory Visit (INDEPENDENT_AMBULATORY_CARE_PROVIDER_SITE_OTHER): Payer: 59

## 2019-05-23 ENCOUNTER — Other Ambulatory Visit: Payer: Self-pay

## 2019-05-23 DIAGNOSIS — R7989 Other specified abnormal findings of blood chemistry: Secondary | ICD-10-CM | POA: Diagnosis not present

## 2019-05-23 DIAGNOSIS — E785 Hyperlipidemia, unspecified: Secondary | ICD-10-CM

## 2019-05-23 LAB — LIPID PANEL
Cholesterol: 211 mg/dL — ABNORMAL HIGH (ref 0–200)
HDL: 50.2 mg/dL (ref >39.00)
LDL Cholesterol: 140 mg/dL — ABNORMAL HIGH (ref 0–99)
NonHDL: 161.2
Total CHOL/HDL Ratio: 4
Triglycerides: 106 mg/dL (ref 0.0–149.0)
VLDL: 21.2 mg/dL (ref 0.0–40.0)

## 2019-05-23 LAB — T4, FREE: Free T4: 0.87 ng/dL (ref 0.60–1.60)

## 2019-05-23 LAB — TSH: TSH: 1.71 u[IU]/mL (ref 0.35–4.50)

## 2019-05-23 LAB — T3, FREE: T3, Free: 3.4 pg/mL (ref 2.3–4.2)

## 2019-05-31 ENCOUNTER — Encounter: Payer: Self-pay | Admitting: Family Medicine

## 2019-06-01 NOTE — Telephone Encounter (Signed)
Yes his thyroid levels are now perfect so the Levothyroxine dose is right on target. He needs to keep taking this, he will never be able to stop it

## 2019-10-24 ENCOUNTER — Encounter: Payer: Self-pay | Admitting: Family Medicine

## 2019-10-24 MED ORDER — TRIAMCINOLONE ACETONIDE 0.1 % EX CREA: 1.0000 "application " | TOPICAL_CREAM | Freq: Two times a day (BID) | CUTANEOUS | 2 refills | Status: DC

## 2019-10-24 NOTE — Telephone Encounter (Signed)
Call in Triamcinolone 0.1% cream to apply BID as needed, 30 grams with 2 rf

## 2020-02-21 ENCOUNTER — Other Ambulatory Visit: Payer: Self-pay

## 2020-02-21 ENCOUNTER — Encounter: Payer: Self-pay | Admitting: Family Medicine

## 2020-02-21 ENCOUNTER — Ambulatory Visit (INDEPENDENT_AMBULATORY_CARE_PROVIDER_SITE_OTHER): Payer: 59 | Admitting: Family Medicine

## 2020-02-21 VITALS — BP 130/78 | HR 62 | Temp 97.6°F | Ht 73.75 in | Wt 210.0 lb

## 2020-02-21 DIAGNOSIS — Z23 Encounter for immunization: Secondary | ICD-10-CM | POA: Diagnosis not present

## 2020-02-21 DIAGNOSIS — E039 Hypothyroidism, unspecified: Secondary | ICD-10-CM

## 2020-02-21 DIAGNOSIS — Z0000 Encounter for general adult medical examination without abnormal findings: Secondary | ICD-10-CM | POA: Diagnosis not present

## 2020-02-21 MED ORDER — LEVOTHYROXINE SODIUM 75 MCG PO TABS: 75.0000 ug | ORAL_TABLET | Freq: Every day | ORAL | 11 refills | Status: DC

## 2020-02-21 MED ORDER — SILDENAFIL CITRATE 20 MG PO TABS
ORAL_TABLET | ORAL | 11 refills | Status: DC
Start: 2020-02-21 — End: 2021-01-10

## 2020-02-21 NOTE — Addendum Note (Signed)
Addended by: Otilio Miu on: 02/21/2020 02:21 PM   Modules accepted: Orders

## 2020-02-21 NOTE — Addendum Note (Signed)
Addended by: Marrion Coy on: 02/21/2020 02:14 PM   Modules accepted: Orders

## 2020-02-21 NOTE — Progress Notes (Signed)
   Subjective:    Patient ID: Samuel Castillo, male    DOB: 18-Nov-1960, 59 y.o.   MRN: [id number]  HPI Here for a well exam. He feels well.    Review of Systems  Constitutional: Negative.   HENT: Negative.   Eyes: Negative.   Respiratory: Negative.   Cardiovascular: Negative.   Gastrointestinal: Negative.   Genitourinary: Negative.   Musculoskeletal: Negative.   Skin: Negative.   Neurological: Negative.   Psychiatric/Behavioral: Negative.        Objective:   Physical Exam Constitutional:      General: He is not in acute distress.    Appearance: Normal appearance. He is well-developed. He is not diaphoretic.  HENT:     Head: Normocephalic and atraumatic.     Right Ear: External ear normal.     Left Ear: External ear normal.     Nose: Nose normal.     Mouth/Throat:     Pharynx: No oropharyngeal exudate.  Eyes:     General: No scleral icterus.       Right eye: No discharge.        Left eye: No discharge.     Conjunctiva/sclera: Conjunctivae normal.     Pupils: Pupils are equal, round, and reactive to light.  Neck:     Thyroid: No thyromegaly.     Vascular: No JVD.     Trachea: No tracheal deviation.  Cardiovascular:     Rate and Rhythm: Normal rate and regular rhythm.     Heart sounds: Normal heart sounds. No murmur heard.  No friction rub. No gallop.   Pulmonary:     Effort: Pulmonary effort is normal. No respiratory distress.     Breath sounds: Normal breath sounds. No wheezing or rales.  Chest:     Chest wall: No tenderness.  Abdominal:     General: Bowel sounds are normal. There is no distension.     Palpations: Abdomen is soft. There is no mass.     Tenderness: There is no abdominal tenderness. There is no guarding or rebound.  Genitourinary:    Penis: Normal. No tenderness.      Testes: Normal.     Prostate: Normal.     Rectum: Normal. Guaiac result negative.  Musculoskeletal:        General: No tenderness. Normal range of motion.     Cervical back:  Neck supple.  Lymphadenopathy:     Cervical: No cervical adenopathy.  Skin:    General: Skin is warm and dry.     Coloration: Skin is not pale.     Findings: No erythema or rash.  Neurological:     Mental Status: He is alert and oriented to person, place, and time.     Cranial Nerves: No cranial nerve deficit.     Motor: No abnormal muscle tone.     Coordination: Coordination normal.     Deep Tendon Reflexes: Reflexes are normal and symmetric. Reflexes normal.  Psychiatric:        Behavior: Behavior normal.        Thought Content: Thought content normal.        Judgment: Judgment normal.           Assessment & Plan:  Well exam. We discussed diet and exercise. Get fasting labs. Alysia Penna, MD

## 2020-02-22 ENCOUNTER — Encounter: Payer: 59 | Admitting: Family Medicine

## 2020-02-22 LAB — T4, FREE: Free T4: 1.2 ng/dL (ref 0.8–1.8)

## 2020-02-22 LAB — CBC WITH DIFFERENTIAL/PLATELET
Absolute Monocytes: 413 cells/uL (ref 200–950)
Basophils Absolute: 51 cells/uL (ref 0–200)
Basophils Relative: 1 %
Eosinophils Absolute: 179 cells/uL (ref 15–500)
Eosinophils Relative: 3.5 %
HCT: 43.9 % (ref 38.5–50.0)
Hemoglobin: 15.4 g/dL (ref 13.2–17.1)
Lymphs Abs: 1204 cells/uL (ref 850–3900)
MCH: 30.1 pg (ref 27.0–33.0)
MCHC: 35.1 g/dL (ref 32.0–36.0)
MCV: 85.7 fL (ref 80.0–100.0)
MPV: 12.3 fL (ref 7.5–12.5)
Monocytes Relative: 8.1 %
Neutro Abs: 3254 cells/uL (ref 1500–7800)
Neutrophils Relative %: 63.8 %
Platelets: 170 10*3/uL (ref 140–400)
RBC: 5.12 10*6/uL (ref 4.20–5.80)
RDW: 12.1 % (ref 11.0–15.0)
Total Lymphocyte: 23.6 %
WBC: 5.1 10*3/uL (ref 3.8–10.8)

## 2020-02-22 LAB — BASIC METABOLIC PANEL WITH GFR
BUN: 19 mg/dL (ref 7–25)
CO2: 29 mmol/L (ref 20–32)
Calcium: 9.3 mg/dL (ref 8.6–10.3)
Chloride: 102 mmol/L (ref 98–110)
Creat: 1.12 mg/dL (ref 0.70–1.25)
GFR, Est African American: 82 mL/min/{1.73_m2} (ref >=60)
GFR, Est Non African American: 71 mL/min/{1.73_m2} (ref >=60)
Glucose, Bld: 84 mg/dL (ref 65–99)
Potassium: 4.5 mmol/L (ref 3.5–5.3)
Sodium: 141 mmol/L (ref 135–146)

## 2020-02-22 LAB — LIPID PANEL
Cholesterol: 217 mg/dL — ABNORMAL HIGH (ref <200)
HDL: 54 mg/dL (ref >=40)
LDL Cholesterol (Calc): 147 mg/dL (calc) — ABNORMAL HIGH
Non-HDL Cholesterol (Calc): 163 mg/dL (calc) — ABNORMAL HIGH (ref <130)
Total CHOL/HDL Ratio: 4 (calc) (ref <5.0)
Triglycerides: 61 mg/dL (ref <150)

## 2020-02-22 LAB — HEPATIC FUNCTION PANEL
AG Ratio: 2.4 (calc) (ref 1.0–2.5)
ALT: 14 U/L (ref 9–46)
AST: 21 U/L (ref 10–35)
Albumin: 4.7 g/dL (ref 3.6–5.1)
Alkaline phosphatase (APISO): 44 U/L (ref 35–144)
Bilirubin, Direct: 0.2 mg/dL (ref 0.0–0.2)
Globulin: 2 g/dL (calc) (ref 1.9–3.7)
Indirect Bilirubin: 1.1 mg/dL (calc) (ref 0.2–1.2)
Total Bilirubin: 1.3 mg/dL — ABNORMAL HIGH (ref 0.2–1.2)
Total Protein: 6.7 g/dL (ref 6.1–8.1)

## 2020-02-22 LAB — PSA: PSA: 0.62 ng/mL (ref <=4.0)

## 2020-02-22 LAB — TSH: TSH: 1.65 mIU/L (ref 0.40–4.50)

## 2020-02-22 LAB — T3, FREE: T3, Free: 3.3 pg/mL (ref 2.3–4.2)

## 2020-03-01 ENCOUNTER — Other Ambulatory Visit: Payer: Self-pay | Admitting: Family Medicine

## 2020-03-05 ENCOUNTER — Ambulatory Visit: Payer: 59 | Admitting: Family Medicine

## 2020-03-05 ENCOUNTER — Other Ambulatory Visit: Payer: Self-pay

## 2020-03-05 ENCOUNTER — Encounter: Payer: Self-pay | Admitting: Family Medicine

## 2020-03-05 VITALS — BP 118/78 | HR 64 | Ht 73.75 in | Wt 211.0 lb

## 2020-03-05 DIAGNOSIS — L03011 Cellulitis of right finger: Secondary | ICD-10-CM

## 2020-03-05 MED ORDER — CEPHALEXIN 500 MG PO CAPS: 500.0000 mg | ORAL_CAPSULE | Freq: Four times a day (QID) | ORAL | 0 refills | Status: DC

## 2020-03-05 NOTE — Progress Notes (Signed)
Established Patient Office Visit  Subjective:  Patient ID: Samuel Castillo, male    DOB: Aug 02, 1960  Age: 59 y.o. MRN: [id number]  CC:  Chief Complaint  Patient presents with  . Hand Pain    Right thumb    HPI Samuel Castillo presents for painful right thumb.  He denies any injury.  He first noted few days ago some mild pain.  He noticed over a year ago a vertical ridge whitish in color running from the distal part to the proximal aspect of the nail.  Denies any acute or remote history of injury.  No fevers or chills.  No known history of MRSA.  No recent hangnail.  Past Medical History:  Diagnosis Date  . Hepatitis B infection    resolved, no viral remnants   . Hx of adenomatous colonic polyps 2014  . Hyperlipidemia   . Thyroid disease     Past Surgical History:  Procedure Laterality Date  . COLONOSCOPY  04/15/2018   per Dr. Rush Landmark, tubular adenomas, repeat in 3 yrs   . TONSILLECTOMY  1970    Family History  Problem Relation Age of Onset  . Breast cancer Mother   . Diabetes Father   . Heart disease Father   . Leukemia Sister   . Healthy Sister   . Healthy Sister   . Healthy Brother     Social History   Socioeconomic History  . Marital status: Married    Spouse name: Not on file  . Number of children: Not on file  . Years of education: Not on file  . Highest education level: Not on file  Occupational History  . Not on file  Tobacco Use  . Smoking status: Never Smoker  . Smokeless tobacco: Never Used  Substance and Sexual Activity  . Alcohol use: Yes    Comment: social  . Drug use: Not Currently  . Sexual activity: Not on file  Other Topics Concern  . Not on file  Social History Narrative  . Not on file   Social Determinants of Health   Financial Resource Strain: Not on file  Food Insecurity: Not on file  Transportation Needs: Not on file  Physical Activity: Not on file  Stress: Not on file  Social Connections: Not on file  Intimate Partner  Violence: Not on file    Outpatient Medications Prior to Visit  Medication Sig Dispense Refill  . levothyroxine (SYNTHROID) 75 MCG tablet TAKE 1 TABLET(75 MCG) BY MOUTH DAILY 90 tablet 0  . sildenafil (REVATIO) 20 MG tablet Take up to 5 tablets a day as needed. 50 tablet 11   No facility-administered medications prior to visit.    No Known Allergies  ROS Review of Systems  Constitutional: Negative for chills and fever.      Objective:    Physical Exam Vitals reviewed.  Constitutional:      Appearance: Normal appearance.  Cardiovascular:     Rate and Rhythm: Normal rate and regular rhythm.  Pulmonary:     Effort: Pulmonary effort is normal.     Breath sounds: Normal breath sounds.  Musculoskeletal:     Comments: Patient has some mild swelling of the right thumb and some erythema just proximal to the nail.  He has some mild yellowish discoloration at the base of the thumbnail.  No fluctuance.  Neurological:     Mental Status: He is alert.     BP 118/78   Pulse 64   Ht 6' 1.75" (  1.873 m)   Wt 211 lb (95.7 kg)   SpO2 97%   BMI 27.27 kg/m  Wt Readings from Last 3 Encounters:  03/05/20 211 lb (95.7 kg)  02/21/20 210 lb (95.3 kg)  02/17/19 207 lb 9.6 oz (94.2 kg)     Health Maintenance Due  Topic Date Due  . Hepatitis C Screening  Never done  . HIV Screening  Never done  . TETANUS/TDAP  05/16/2017  . COVID-19 Vaccine (3 - Booster for Moderna series) 11/29/2019    There are no preventive care reminders to display for this patient.  Lab Results  Component Value Date   TSH 1.65 02/21/2020   Lab Results  Component Value Date   WBC 5.1 02/21/2020   HGB 15.4 02/21/2020   HCT 43.9 02/21/2020   MCV 85.7 02/21/2020   PLT 170 02/21/2020   Lab Results  Component Value Date   NA 141 02/21/2020   K 4.5 02/21/2020   CO2 29 02/21/2020   GLUCOSE 84 02/21/2020   BUN 19 02/21/2020   CREATININE 1.12 02/21/2020   BILITOT 1.3 (H) 02/21/2020   ALKPHOS 49  02/17/2019   AST 21 02/21/2020   ALT 14 02/21/2020   PROT 6.7 02/21/2020   ALBUMIN 4.8 02/17/2019   CALCIUM 9.3 02/21/2020   GFR 60.48 02/17/2019   Lab Results  Component Value Date   CHOL 217 (H) 02/21/2020   Lab Results  Component Value Date   HDL 54 02/21/2020   Lab Results  Component Value Date   LDLCALC 147 (H) 02/21/2020   Lab Results  Component Value Date   TRIG 61 02/21/2020   Lab Results  Component Value Date   CHOLHDL 4.0 02/21/2020   Lab Results  Component Value Date   HGBA1C 5.5 12/04/2017      Assessment & Plan:   Paronychia involving the right thumb.  We explained that surgical drainage is frequently required to bring about healing but patient was very reluctant.  He is aware that without drainage this will likely still need drainage in the next day or 2.  We did agree to starting Keflex 500 mg 4 times daily for the next 7 days and warm soaks several times daily.  Recommend this be reassessed in 2 days and if not improved at that point will certainly need digital block and surgical drainage and follow-up sooner as needed  Meds ordered this encounter  Medications  . cephALEXin (KEFLEX) 500 MG capsule    Sig: Take 1 capsule (500 mg total) by mouth 4 (four) times daily.    Dispense:  28 capsule    Refill:  0    Follow-up: Return in about 2 days (around 03/07/2020).    Carolann Littler, MD

## 2020-03-05 NOTE — Patient Instructions (Signed)
Try warm water soaks several times daily  Follow up Wednesday to recheck and sooner as needed.

## 2020-03-06 ENCOUNTER — Ambulatory Visit: Payer: 59 | Admitting: Family Medicine

## 2020-03-06 ENCOUNTER — Other Ambulatory Visit: Payer: Self-pay

## 2020-03-06 VITALS — BP 118/82 | HR 68 | Ht 73.75 in | Wt 212.0 lb

## 2020-03-06 DIAGNOSIS — L02511 Cutaneous abscess of right hand: Secondary | ICD-10-CM

## 2020-03-06 NOTE — Patient Instructions (Signed)
Keep thumb dry for next 24 hours  Starting tomorrow night or Thursday, take off bandage and clean with soap and water  Then reapply small amount of topical antibiotic and keep bandage on for 5-7 days  Elevate hand frequently tonight.

## 2020-03-06 NOTE — Progress Notes (Signed)
Established Patient Office Visit  Subjective:  Patient ID: Samuel Castillo, male    DOB: [birth year]  Age: 59 y.o. MRN: [id number]  CC:  Chief Complaint  Patient presents with  . Hand Problem    HPI [place] Sigmon Rockman presents for follow-up regarding abscess and cellulitis of the right thumb.  He was seen just yesterday.  We had advised digital block with either puncture through the base of the nail or partial nail excision because of our concern for complicated paronychia.  Patient declined surgical intervention.  We did start Keflex 500 mg 4 times daily.  His throbbing and thumb pain worsened over the night.  He has not had any fevers or chills.  No drainage.  He has been soaking this in warm water several times daily.  Past Medical History:  Diagnosis Date  . Hepatitis B infection    resolved, no viral remnants   . Hx of adenomatous colonic polyps 2014  . Hyperlipidemia   . Thyroid disease     Past Surgical History:  Procedure Laterality Date  . COLONOSCOPY  04/15/2018   per Dr. Rush Landmark, tubular adenomas, repeat in 3 yrs   . TONSILLECTOMY  1970    Family History  Problem Relation Age of Onset  . Breast cancer Mother   . Diabetes Father   . Heart disease Father   . Leukemia Sister   . Healthy Sister   . Healthy Sister   . Healthy Brother     Social History   Socioeconomic History  . Marital status: Married    Spouse name: Not on file  . Number of children: Not on file  . Years of education: Not on file  . Highest education level: Not on file  Occupational History  . Not on file  Tobacco Use  . Smoking status: Never Smoker  . Smokeless tobacco: Never Used  Substance and Sexual Activity  . Alcohol use: Yes    Comment: social  . Drug use: Not Currently  . Sexual activity: Not on file  Other Topics Concern  . Not on file  Social History Narrative  . Not on file   Social Determinants of Health   Financial Resource Strain: Not on file  Food Insecurity:  Not on file  Transportation Needs: Not on file  Physical Activity: Not on file  Stress: Not on file  Social Connections: Not on file  Intimate Partner Violence: Not on file    Outpatient Medications Prior to Visit  Medication Sig Dispense Refill  . cephALEXin (KEFLEX) 500 MG capsule Take 1 capsule (500 mg total) by mouth 4 (four) times daily. 28 capsule 0  . levothyroxine (SYNTHROID) 75 MCG tablet TAKE 1 TABLET(75 MCG) BY MOUTH DAILY 90 tablet 0  . sildenafil (REVATIO) 20 MG tablet Take up to 5 tablets a day as needed. 50 tablet 11   No facility-administered medications prior to visit.    No Known Allergies  ROS Review of Systems  Constitutional: Negative for chills and fever.      Objective:    Physical Exam Constitutional:      General: He is not in acute distress.    Appearance: Normal appearance. He is not ill-appearing or toxic-appearing.  Cardiovascular:     Rate and Rhythm: Normal rate and regular rhythm.  Skin:    Comments: Right thumb reveals some obvious swelling and erythema from the distal interphalangeal joint toward the base of the nail.  He has some mild yellowish discoloration at  the base of the nail.  Not much change from yesterday overall- though perhaps slightly more swollen.  Tender to palpation around the base of the nail  Neurological:     Mental Status: He is alert.     BP 118/82   Pulse 68   Ht 6' 1.75" (1.873 m)   Wt 212 lb (96.2 kg)   SpO2 96%   BMI 27.40 kg/m  Wt Readings from Last 3 Encounters:  03/06/20 212 lb (96.2 kg)  03/05/20 211 lb (95.7 kg)  02/21/20 210 lb (95.3 kg)     Health Maintenance Due  Topic Date Due  . Hepatitis C Screening  Never done  . HIV Screening  Never done  . TETANUS/TDAP  05/16/2017  . COVID-19 Vaccine (3 - Booster for Moderna series) 11/29/2019    There are no preventive care reminders to display for this patient.  Lab Results  Component Value Date   TSH 1.65 02/21/2020   Lab Results  Component  Value Date   WBC 5.1 02/21/2020   HGB 15.4 02/21/2020   HCT 43.9 02/21/2020   MCV 85.7 02/21/2020   PLT 170 02/21/2020   Lab Results  Component Value Date   NA 141 02/21/2020   K 4.5 02/21/2020   CO2 29 02/21/2020   GLUCOSE 84 02/21/2020   BUN 19 02/21/2020   CREATININE 1.12 02/21/2020   BILITOT 1.3 (H) 02/21/2020   ALKPHOS 49 02/17/2019   AST 21 02/21/2020   ALT 14 02/21/2020   PROT 6.7 02/21/2020   ALBUMIN 4.8 02/17/2019   CALCIUM 9.3 02/21/2020   GFR 60.48 02/17/2019   Lab Results  Component Value Date   CHOL 217 (H) 02/21/2020   Lab Results  Component Value Date   HDL 54 02/21/2020   Lab Results  Component Value Date   LDLCALC 147 (H) 02/21/2020   Lab Results  Component Value Date   TRIG 61 02/21/2020   Lab Results  Component Value Date   CHOLHDL 4.0 02/21/2020   Lab Results  Component Value Date   HGBA1C 5.5 12/04/2017      Assessment & Plan:   Paronychia/abscess right thumb with mild surrounding cellulitis changes.  Patient already on Keflex 500 mg 4 times daily and warm salt water soaks without improvement  We recommended digital block followed by partial nail excision and drainage of abscess.  We described risks including bleeding and the fact that removal of partial nail would take several months to grow back and patient consented.  We prepped the thumb with Betadine.  Using number 25-gauge 1 inch needle we injected 2% plain Xylocaine into the base of the thumb on the right and left side and dorsally to achieve full digital block.  We then used some straight hemostats to free up the portion of the involved nail.  As hemostats were advanced toward the base of the nail we freed up a large pocket of pus and there was copious amount of drainage of pus from the base of the nail.  We then freed up the medial one third border of the thumbnail with straight hemostats and used surgical scissors and cut from the distal nail to the base.  We used straight  hemostats to remove the nail without difficulty.  Minimal bleeding.  No other pockets were pus were identified.  Topical antibiotic and loose dressing applied with Coban.  Pt tolerated well.    -We recommended he keep this dry for the next 24 hours and then take  off and gently clean with soap and water daily for the next several days followed by topical antibiotic and light dressing. -We suggest that he follow-up with primary or with me in the next week or 2 to have this reassessed and sooner as needed.  Also strongly advise he continue to finish out the Keflex for the next week.  Follow-up for any fever, progressive swelling, progressive erythema, or other concerns.  No orders of the defined types were placed in this encounter.   Follow-up: No follow-ups on file.    Carolann Littler, MD

## 2020-03-07 ENCOUNTER — Ambulatory Visit: Payer: 59 | Admitting: Family Medicine

## 2020-03-14 IMAGING — DX CERVICAL SPINE - 2-3 VIEW
3 series · 3 of 3 positions shown · non-contrast
Comparison: None.

CLINICAL DATA: Cervicalgia

EXAM:
CERVICAL SPINE - 2-3 VIEW

[c-spine lat]
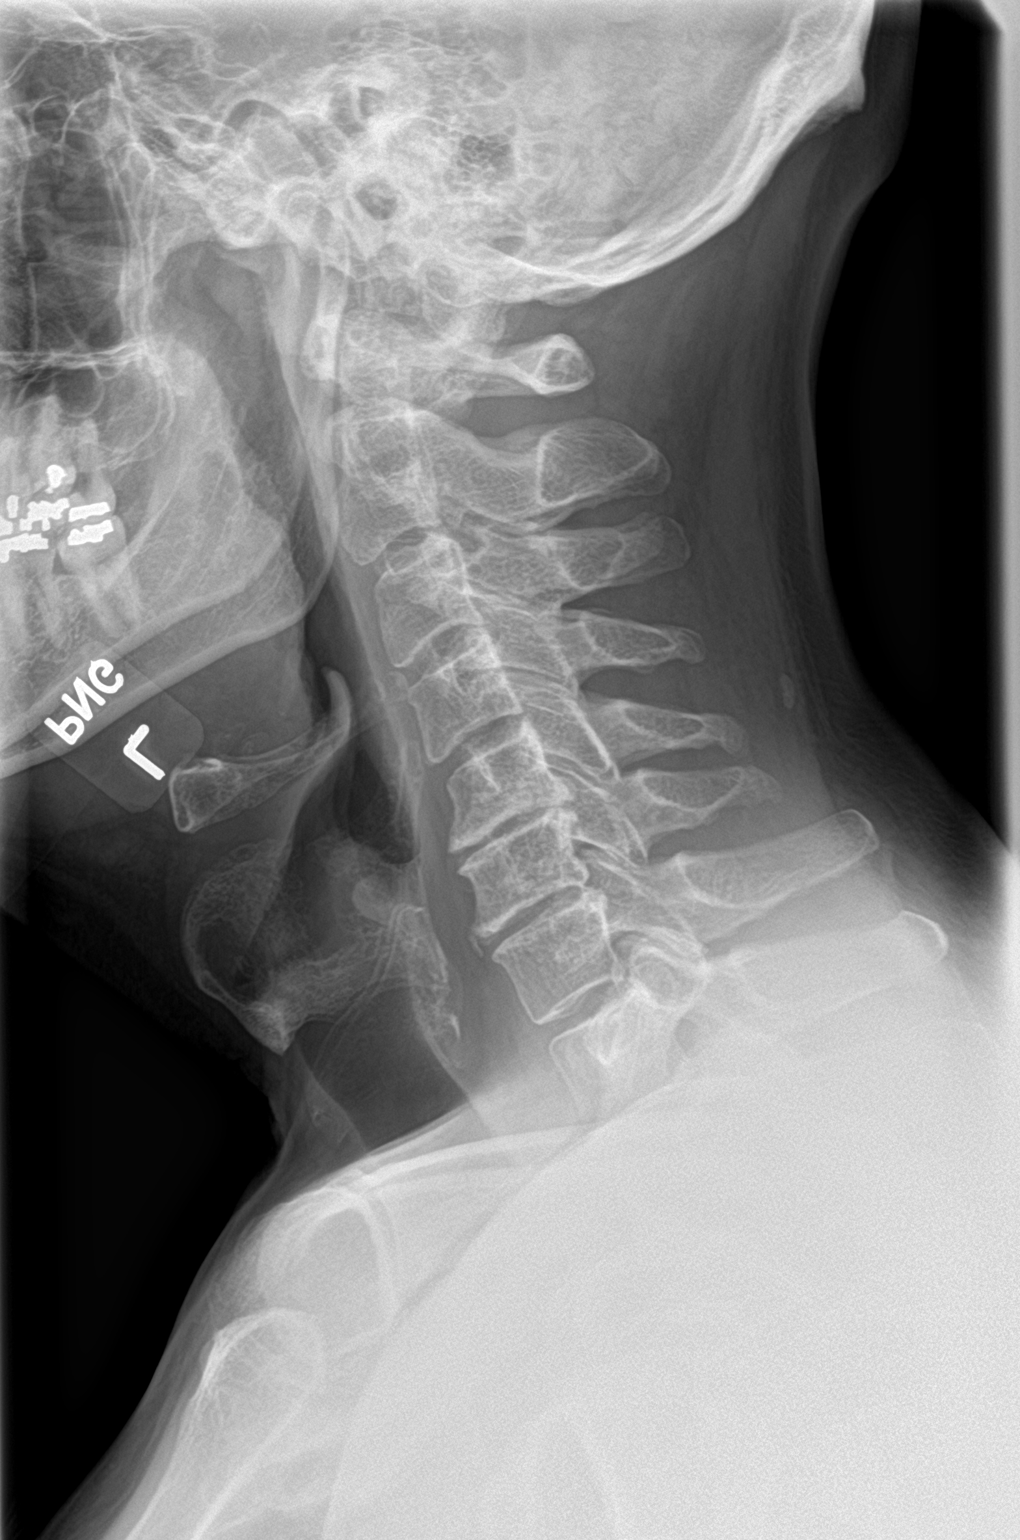

[c-spine ap]
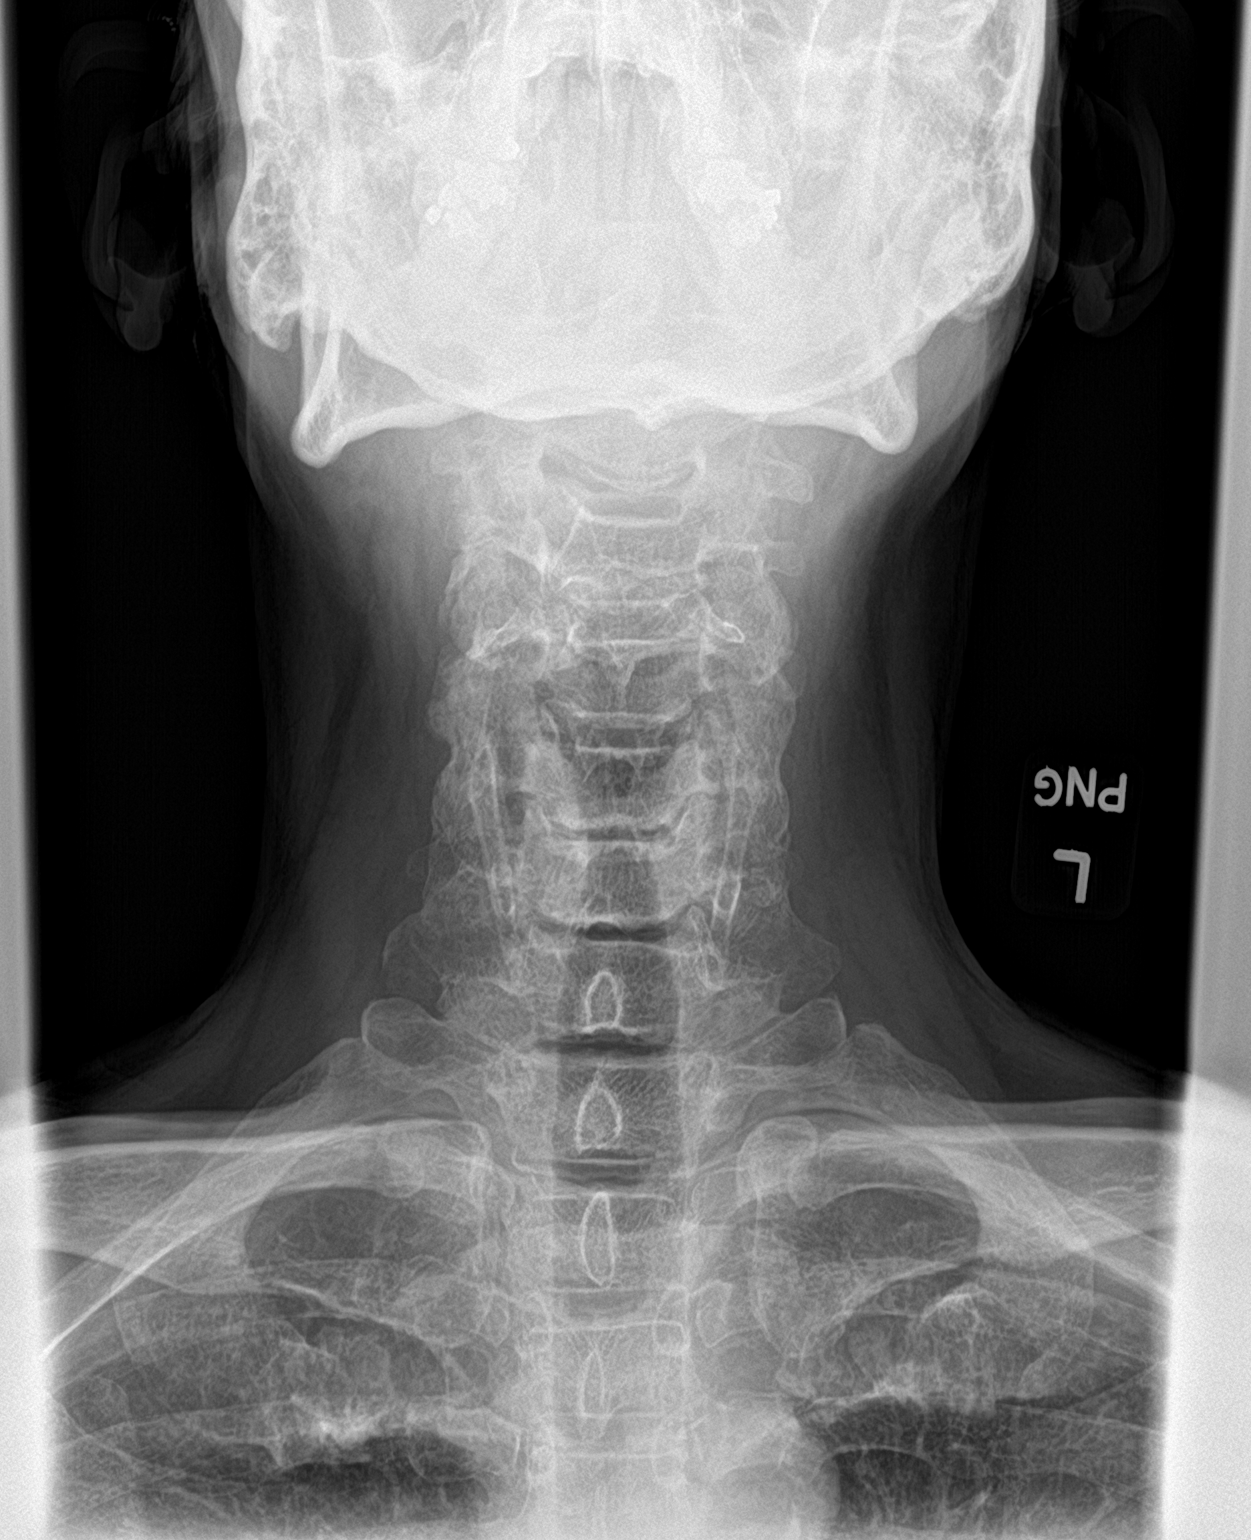

[c-spine open mouth]
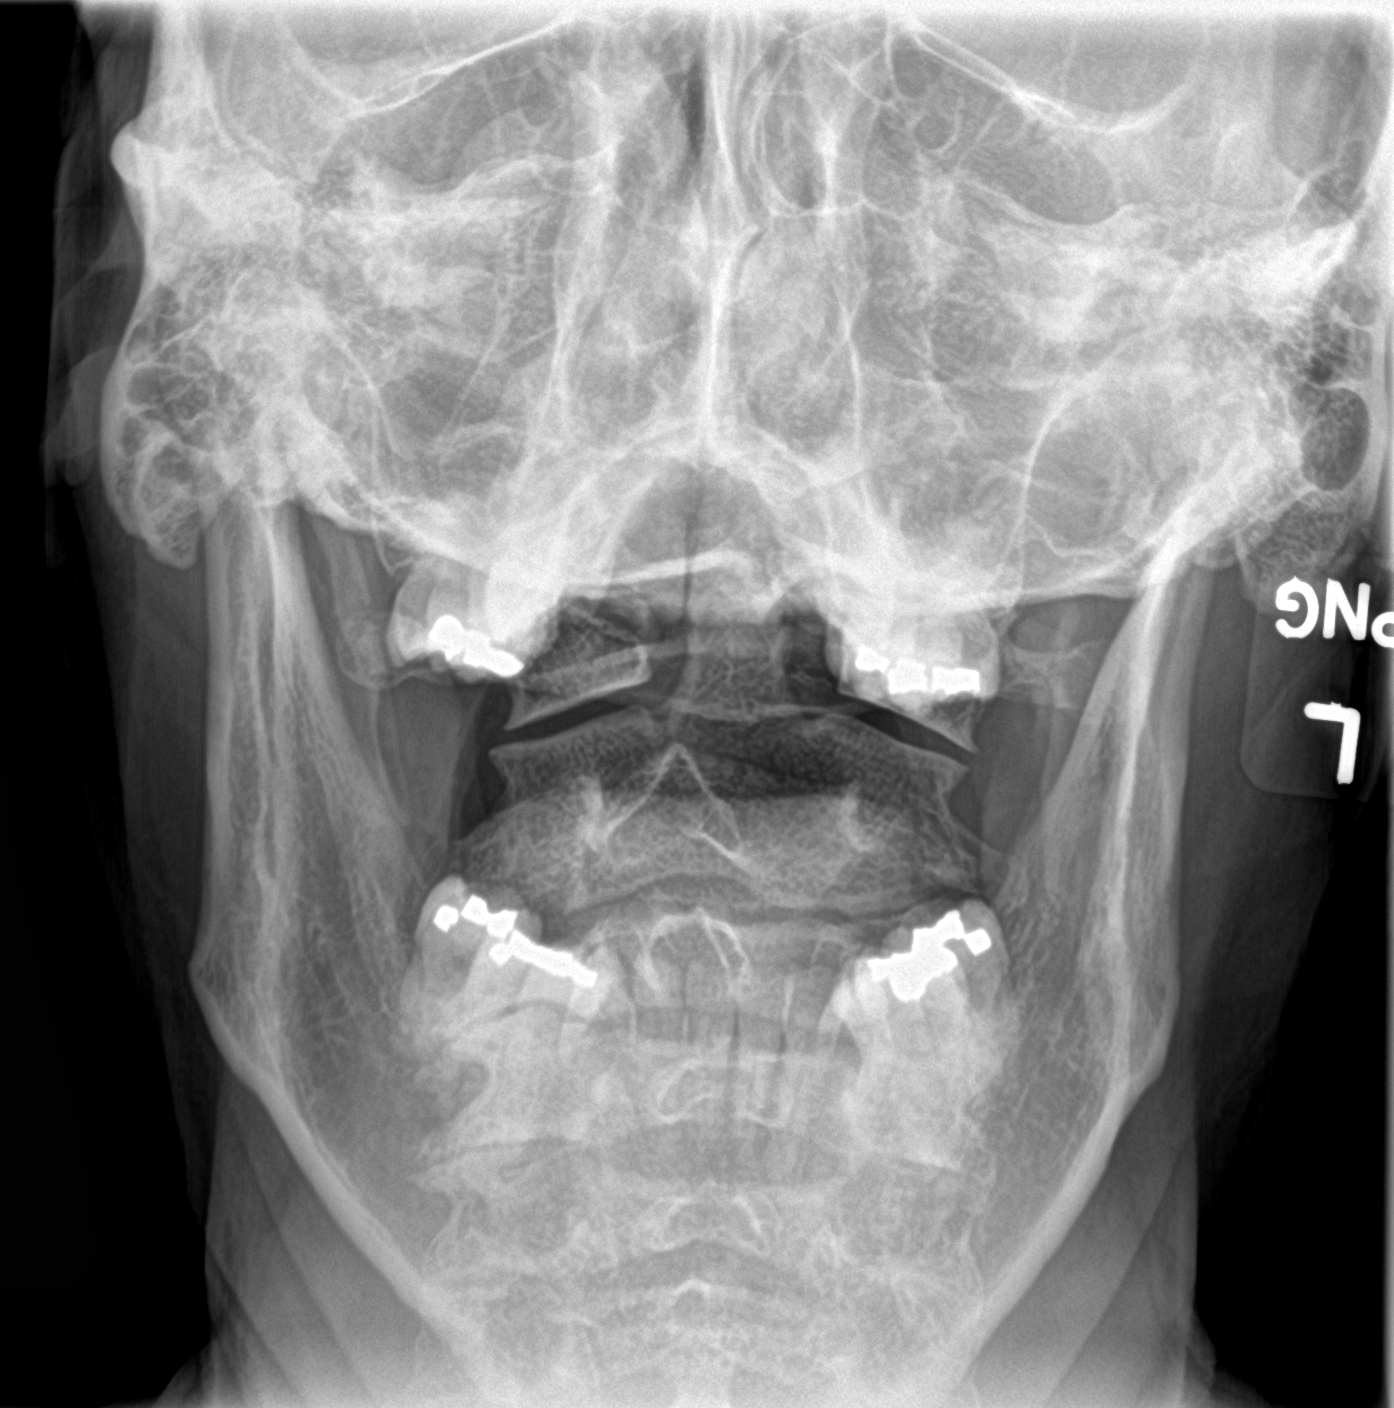

[3 of 3 positions shown; findings below may reference images not displayed]

FINDINGS: Frontal, lateral, and open-mouth odontoid images were obtained.
There is no fracture or spondylolisthesis. Prevertebral soft tissues
and predental space regions are normal. There is moderately severe
disc space narrowing at C5-6 and C6-7. There is relatively mild disc
space narrowing at C4-5. There are anterior and posterior
osteophytes at C5 and C6. No erosive changes. Lung apices are clear.
IMPRESSION: Osteoarthritic change, most marked at C5-6 and C6-7. No fracture or
spondylolisthesis.

## 2021-01-03 ENCOUNTER — Encounter: Payer: 59 | Admitting: Family Medicine

## 2021-01-10 ENCOUNTER — Other Ambulatory Visit: Payer: Self-pay

## 2021-01-10 ENCOUNTER — Ambulatory Visit (INDEPENDENT_AMBULATORY_CARE_PROVIDER_SITE_OTHER): Payer: 59 | Admitting: Family Medicine

## 2021-01-10 ENCOUNTER — Encounter: Payer: Self-pay | Admitting: Family Medicine

## 2021-01-10 VITALS — BP 124/80 | HR 56 | Temp 97.7°F | Ht 73.5 in | Wt 213.0 lb

## 2021-01-10 DIAGNOSIS — Z0000 Encounter for general adult medical examination without abnormal findings: Secondary | ICD-10-CM | POA: Diagnosis not present

## 2021-01-10 DIAGNOSIS — Z23 Encounter for immunization: Secondary | ICD-10-CM | POA: Diagnosis not present

## 2021-01-10 DIAGNOSIS — E039 Hypothyroidism, unspecified: Secondary | ICD-10-CM | POA: Diagnosis not present

## 2021-01-10 DIAGNOSIS — E785 Hyperlipidemia, unspecified: Secondary | ICD-10-CM

## 2021-01-10 LAB — CBC WITH DIFFERENTIAL/PLATELET
Basophils Absolute: 0.1 10*3/uL (ref 0.0–0.1)
Basophils Relative: 1.2 % (ref 0.0–3.0)
Eosinophils Absolute: 0.2 10*3/uL (ref 0.0–0.7)
Eosinophils Relative: 4.4 % (ref 0.0–5.0)
HCT: 44.8 % (ref 39.0–52.0)
Hemoglobin: 15.5 g/dL (ref 13.0–17.0)
Lymphocytes Relative: 23.3 % (ref 12.0–46.0)
Lymphs Abs: 1.2 10*3/uL (ref 0.7–4.0)
MCHC: 34.6 g/dL (ref 30.0–36.0)
MCV: 83.6 fl (ref 78.0–100.0)
Monocytes Absolute: 0.4 10*3/uL (ref 0.1–1.0)
Monocytes Relative: 7.5 % (ref 3.0–12.0)
Neutro Abs: 3.3 10*3/uL (ref 1.4–7.7)
Neutrophils Relative %: 63.6 % (ref 43.0–77.0)
Platelets: 162 10*3/uL (ref 150.0–400.0)
RBC: 5.36 Mil/uL (ref 4.22–5.81)
RDW: 12.9 % (ref 11.5–15.5)
WBC: 5.2 10*3/uL (ref 4.0–10.5)

## 2021-01-10 LAB — BASIC METABOLIC PANEL
BUN: 19 mg/dL (ref 6–23)
CO2: 31 mEq/L (ref 19–32)
Calcium: 9.3 mg/dL (ref 8.4–10.5)
Chloride: 101 mEq/L (ref 96–112)
Creatinine, Ser: 1.21 mg/dL (ref 0.40–1.50)
GFR: 64.19 mL/min (ref >60.00)
Glucose, Bld: 96 mg/dL (ref 70–99)
Potassium: 4 mEq/L (ref 3.5–5.1)
Sodium: 141 mEq/L (ref 135–145)

## 2021-01-10 LAB — HEPATIC FUNCTION PANEL
ALT: 14 U/L (ref 0–53)
AST: 20 U/L (ref 0–37)
Albumin: 4.6 g/dL (ref 3.5–5.2)
Alkaline Phosphatase: 45 U/L (ref 39–117)
Bilirubin, Direct: 0.2 mg/dL (ref 0.0–0.3)
Total Bilirubin: 1.2 mg/dL (ref 0.2–1.2)
Total Protein: 7 g/dL (ref 6.0–8.3)

## 2021-01-10 LAB — LIPID PANEL
Cholesterol: 231 mg/dL — ABNORMAL HIGH (ref 0–200)
HDL: 49.4 mg/dL (ref >39.00)
LDL Cholesterol: 167 mg/dL — ABNORMAL HIGH (ref 0–99)
NonHDL: 181.79
Total CHOL/HDL Ratio: 5
Triglycerides: 72 mg/dL (ref 0.0–149.0)
VLDL: 14.4 mg/dL (ref 0.0–40.0)

## 2021-01-10 LAB — T3, FREE: T3, Free: 3 pg/mL (ref 2.3–4.2)

## 2021-01-10 LAB — T4, FREE: Free T4: 0.78 ng/dL (ref 0.60–1.60)

## 2021-01-10 LAB — HEMOGLOBIN A1C: Hgb A1c MFr Bld: 5.6 % (ref 4.6–6.5)

## 2021-01-10 LAB — PSA: PSA: 0.54 ng/mL (ref 0.10–4.00)

## 2021-01-10 LAB — TSH: TSH: 1.94 u[IU]/mL (ref 0.35–5.50)

## 2021-01-10 MED ORDER — LEVOTHYROXINE SODIUM 75 MCG PO TABS
ORAL_TABLET | ORAL | 3 refills | Status: DC
Start: 2021-01-10 — End: 2021-03-13

## 2021-01-10 MED ORDER — ATORVASTATIN CALCIUM 10 MG PO TABS
10.0000 mg | ORAL_TABLET | Freq: Every day | ORAL | 3 refills | Status: DC
Start: 2021-01-10 — End: 2022-01-29

## 2021-01-10 NOTE — Addendum Note (Signed)
Addended by: Rosalyn Gess D on: 01/10/2021 09:12 AM   Modules accepted: Orders

## 2021-01-10 NOTE — Progress Notes (Signed)
   Subjective:    Patient ID: Samuel Castillo, male    DOB: 1960/09/22, 60 y.o.   MRN: [id number]  HPI Here for a well exam. He feels great. He is thinking about retiring next year.   Review of Systems  Constitutional: Negative.   HENT: Negative.    Eyes: Negative.   Respiratory: Negative.    Cardiovascular: Negative.   Gastrointestinal: Negative.   Genitourinary: Negative.   Musculoskeletal: Negative.   Skin: Negative.   Neurological: Negative.   Psychiatric/Behavioral: Negative.        Objective:   Physical Exam Constitutional:      General: He is not in acute distress.    Appearance: Normal appearance. He is well-developed. He is not diaphoretic.  HENT:     Head: Normocephalic and atraumatic.     Right Ear: External ear normal.     Left Ear: External ear normal.     Nose: Nose normal.     Mouth/Throat:     Pharynx: No oropharyngeal exudate.  Eyes:     General: No scleral icterus.       Right eye: No discharge.        Left eye: No discharge.     Conjunctiva/sclera: Conjunctivae normal.     Pupils: Pupils are equal, round, and reactive to light.  Neck:     Thyroid: No thyromegaly.     Vascular: No JVD.     Trachea: No tracheal deviation.  Cardiovascular:     Rate and Rhythm: Normal rate and regular rhythm.     Heart sounds: Normal heart sounds. No murmur heard.   No friction rub. No gallop.  Pulmonary:     Effort: Pulmonary effort is normal. No respiratory distress.     Breath sounds: Normal breath sounds. No wheezing or rales.  Chest:     Chest wall: No tenderness.  Abdominal:     General: Bowel sounds are normal. There is no distension.     Palpations: Abdomen is soft. There is no mass.     Tenderness: There is no abdominal tenderness. There is no guarding or rebound.  Genitourinary:    Penis: Normal. No tenderness.      Testes: Normal.     Prostate: Normal.     Rectum: Normal. Guaiac result negative.  Musculoskeletal:        General: No tenderness.  Normal range of motion.     Cervical back: Neck supple.  Lymphadenopathy:     Cervical: No cervical adenopathy.  Skin:    General: Skin is warm and dry.     Coloration: Skin is not pale.     Findings: No erythema or rash.  Neurological:     Mental Status: He is alert and oriented to person, place, and time.     Cranial Nerves: No cranial nerve deficit.     Motor: No abnormal muscle tone.     Coordination: Coordination normal.     Deep Tendon Reflexes: Reflexes are normal and symmetric. Reflexes normal.  Psychiatric:        Behavior: Behavior normal.        Thought Content: Thought content normal.        Judgment: Judgment normal.          Assessment & Plan:  Well exam. We discussed diet and exercise. Get fasting labs. Alysia Penna, MD

## 2021-01-14 ENCOUNTER — Encounter: Payer: Self-pay | Admitting: Family Medicine

## 2021-01-18 NOTE — Telephone Encounter (Signed)
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 If not, try the [facility]

## 2021-03-11 ENCOUNTER — Other Ambulatory Visit: Payer: Self-pay | Admitting: Family Medicine

## 2021-03-11 DIAGNOSIS — E039 Hypothyroidism, unspecified: Secondary | ICD-10-CM

## 2021-04-16 ENCOUNTER — Encounter: Payer: Self-pay | Admitting: Family Medicine

## 2021-04-16 ENCOUNTER — Other Ambulatory Visit (INDEPENDENT_AMBULATORY_CARE_PROVIDER_SITE_OTHER): Payer: PRIVATE HEALTH INSURANCE

## 2021-04-16 DIAGNOSIS — E785 Hyperlipidemia, unspecified: Secondary | ICD-10-CM

## 2021-04-16 LAB — LIPID PANEL
Cholesterol: 167 mg/dL (ref 0–200)
HDL: 57.1 mg/dL (ref >39.00)
LDL Cholesterol: 97 mg/dL (ref 0–99)
NonHDL: 110.27
Total CHOL/HDL Ratio: 3
Triglycerides: 65 mg/dL (ref 0.0–149.0)
VLDL: 13 mg/dL (ref 0.0–40.0)

## 2021-04-16 LAB — HEPATIC FUNCTION PANEL
ALT: 18 U/L (ref 0–53)
AST: 23 U/L (ref 0–37)
Albumin: 4.6 g/dL (ref 3.5–5.2)
Alkaline Phosphatase: 45 U/L (ref 39–117)
Bilirubin, Direct: 0.2 mg/dL (ref 0.0–0.3)
Total Bilirubin: 1.4 mg/dL — ABNORMAL HIGH (ref 0.2–1.2)
Total Protein: 7.2 g/dL (ref 6.0–8.3)

## 2021-04-16 NOTE — Addendum Note (Signed)
Addended by: Rosalyn Gess D on: 04/16/2021 08:04 AM   Modules accepted: Orders

## 2021-04-17 NOTE — Telephone Encounter (Signed)
See my Result Note  

## 2021-06-26 ENCOUNTER — Encounter: Payer: Self-pay | Admitting: Family Medicine

## 2021-06-28 ENCOUNTER — Telehealth (INDEPENDENT_AMBULATORY_CARE_PROVIDER_SITE_OTHER): Payer: 59 | Admitting: Family Medicine

## 2021-06-28 ENCOUNTER — Encounter: Payer: Self-pay | Admitting: Family Medicine

## 2021-06-28 DIAGNOSIS — H9313 Tinnitus, bilateral: Secondary | ICD-10-CM

## 2021-06-28 NOTE — Progress Notes (Signed)
? ?Subjective:  ? ? Patient ID: Samuel Castillo, male    DOB: [birth year], 61 y.o.   MRN: [id number] ? ?HPI ?Virtual Visit via Video Note ? ?I connected with the patient on 06/28/21 at  2:15 PM EDT by a video enabled telemedicine application and verified that I am speaking with the correct person using two identifiers. ? Location patient: home ?Location provider:work or home office ?Persons participating in the virtual visit: patient, provider ? ?I discussed the limitations of evaluation and management by telemedicine and the availability of in person appointments. The patient expressed understanding and agreed to proceed. ? ? ?HPI: ?Here to discuss ringing in the ears which started over a year ago. Both ears are affected equally. Sometimes he hears a continuous buzzing sound and sometimes he hears a "beeping" sound. He has not noticed much hearing loss, but he did see an audiologist last winter to be checked. She told him he has a slight hearing loss but nothing to worry about at this point. He denies any pain or dizziness. He actually talked to his brother (who is an ENT doctor) about this, and he said there is not much to do about it. [name] rarely uses NSAID's. He is concerned that his BP may be contributing to this, even though it has always been normal when checked in our office.  ? ? ?ROS: See pertinent positives and negatives per HPI. ? ?Past Medical History:  ?Diagnosis Date  ? Hepatitis B infection   ? resolved, no viral remnants   ? Hx of adenomatous colonic polyps 2014  ? Hyperlipidemia   ? Thyroid disease   ? ? ?Past Surgical History:  ?Procedure Laterality Date  ? COLONOSCOPY  04/15/2018  ? per Dr. Rush Landmark, tubular adenomas, repeat in 3 yrs   ? TONSILLECTOMY  1970  ? ? ?Family History  ?Problem Relation Age of Onset  ? Breast cancer Mother   ? Diabetes Father   ? Heart disease Father   ? Leukemia Sister   ? Healthy Sister   ? Healthy Sister   ? Healthy Brother   ? ? ? ?Current Outpatient Medications:  ?   atorvastatin (LIPITOR) 10 MG tablet, Take 1 tablet (10 mg total) by mouth daily., Disp: 90 tablet, Rfl: 3 ?  levothyroxine (SYNTHROID) 75 MCG tablet, TAKE 1 TABLET(75 MCG) BY MOUTH DAILY, Disp: 90 tablet, Rfl: 1 ? ?EXAM: ? ?VITALS per patient if applicable: ? ?GENERAL: alert, oriented, appears well and in no acute distress ? ?HEENT: atraumatic, conjunttiva clear, no obvious abnormalities on inspection of external nose and ears ? ?NECK: normal movements of the head and neck ? ?LUNGS: on inspection no signs of respiratory distress, breathing rate appears normal, no obvious gross SOB, gasping or wheezing ? ?CV: no obvious cyanosis ? ?MS: moves all visible extremities without noticeable abnormality ? ?PSYCH/NEURO: pleasant and cooperative, no obvious depression or anxiety, speech and thought processing grossly intact ? ?ASSESSMENT AND PLAN: ?Bilateral tinnitus. First he will obtain a BP cuff and he will check this several times a day for a few weeks. He will then report back to [place]. I suggested he try a noise maker in his bedroom ar night to mask the sounds. I also advised him to wear hearing protection any time he is exposed to loud noises (like mowing his grass).  ?Alysia Penna, MD ? ?Discussed the following assessment and plan: ? ?No diagnosis found. ? ? ?  ?I discussed the assessment and treatment plan with the patient. The  patient was provided an opportunity to ask questions and all were answered. The patient agreed with the plan and demonstrated an understanding of the instructions. ?  ?The patient was advised to call back or seek an in-person evaluation if the symptoms worsen or if the condition fails to improve as anticipated. ? ?  ? ? ?Review of Systems ? ?   ?Objective:  ? Physical Exam ? ? ? ? ?   ?Assessment & Plan:  ? ? ?

## 2021-07-25 ENCOUNTER — Encounter: Payer: Self-pay | Admitting: Gastroenterology

## 2021-07-29 ENCOUNTER — Encounter: Payer: Self-pay | Admitting: Family Medicine

## 2021-08-14 ENCOUNTER — Encounter: Payer: Self-pay | Admitting: Family Medicine

## 2021-08-15 ENCOUNTER — Telehealth (INDEPENDENT_AMBULATORY_CARE_PROVIDER_SITE_OTHER): Payer: 59 | Admitting: Family Medicine

## 2021-08-15 ENCOUNTER — Encounter: Payer: Self-pay | Admitting: Family Medicine

## 2021-08-15 VITALS — Temp 97.1°F | Ht 74.0 in | Wt 205.0 lb

## 2021-08-15 DIAGNOSIS — U071 COVID-19: Secondary | ICD-10-CM | POA: Diagnosis not present

## 2021-08-15 MED ORDER — NIRMATRELVIR/RITONAVIR (PAXLOVID)TABLET: 3.0000 | ORAL_TABLET | Freq: Two times a day (BID) | ORAL | 0 refills | Status: AC

## 2021-08-15 NOTE — Progress Notes (Signed)
covid

## 2021-08-15 NOTE — Patient Instructions (Addendum)
HOME CARE TIPS:   -I sent the medication(s) we discussed to your pharmacy: Meds ordered this encounter  Medications   nirmatrelvir/ritonavir EUA (PAXLOVID) 20 x 150 MG & 10 x '100MG'$  TABS    Sig: Take 3 tablets by mouth 2 (two) times daily for 5 days. (Take nirmatrelvir 150 mg two tablets twice daily for 5 days and ritonavir 100 mg one tablet twice daily for 5 days) Patient GFR is > 60 on last check in october    Dispense:  30 tablet    Refill:  0     -I sent in the Covid19 treatment or referral you requested per our discussion. Please see the information provided below and discuss further with the pharmacist/treatment team.  -If taking Paxlovid, please review all medications, supplement and over the counter drugs with your pharmacist and ask them to check for any interactions. Please make the following changes to your regular medications while taking Paxlovid: *STOP your cholesterol medication Lipitor (atorvastatin) while taking paxlovid. You can restart it 3 days after finishing the Paxlovid.  -there is a chance of rebound illness with covid after improving. This can happen whether or not you take an antiviral treatment. If you become sick again with covid after getting better, please schedule a follow up virtual visit and isolate again.  -nasal saline sinus rinses twice daily  -stay hydrated, drink plenty of fluids and eat small healthy meals - avoid dairy  -follow up with your doctor in 2-3 days unless improving and feeling better  -stay home while sick, except to seek medical care. If you have COVID19, you will likely be contagious for 7-10 days. Flu or Influenza is likely contagious for about 7 days. Other respiratory viral infections remain contagious for 5-10+ days depending on the virus and many other factors. Wear a good mask that fits snugly (such as N95 or KN95) if around others to reduce the risk of transmission.  It was nice to meet you today, and I really hope you are feeling  better soon. I help [facility] out with telemedicine visits on Tuesdays and Thursdays and am happy to help if you need a follow up virtual visit on those days. Otherwise, if you have any concerns or questions following this visit please schedule a follow up visit with your Primary Care doctor or seek care at a local urgent care clinic to avoid delays in care.    Seek in person care or schedule a follow up video visit promptly if your symptoms worsen, new concerns arise or you are not improving with treatment. Call 911 and/or seek emergency care if your symptoms are severe or life threatening.    See the following link for the most recent information regarding Paxlovid:  www.paxlovid.com   Nirmatrelvir; Ritonavir Tablets What is this medication? NIRMATRELVIR; RITONAVIR (NIR ma TREL vir; ri TOE na veer) treats mild to moderate COVID-19. It may help people who are at high risk of developing severe illness. This medication works by limiting the spread of the virus in your body. The FDA has allowed the emergency use of this medication. This medicine may be used for other purposes; ask your health care provider or pharmacist if you have questions. COMMON BRAND NAME(S): PAXLOVID What should I tell my care team before I take this medication? They need to know if you have any of these conditions: Any allergies Any serious illness Kidney disease Liver disease An unusual or allergic reaction to nirmatrelvir, ritonavir, other medications, foods, dyes, or preservatives Pregnant  or trying to get pregnant Breast-feeding How should I use this medication? This product contains 2 different medications that are packaged together. For the standard dose, take 2 pink tablets of nirmatrelvir with 1 white tablet of ritonavir (3 tablets total) by mouth with water twice daily. Talk to your care team if you have kidney disease. You may need a different dose. Swallow the tablets whole. You can take it with or without  food. If it upsets your stomach, take it with food. Take all of this medication unless your care team tells you to stop it early. Keep taking it even if you think you are better. Talk to your care team about the use of this medication in children. While it may be prescribed for children as young as 12 years for selected conditions, precautions do apply. Overdosage: If you think you have taken too much of this medicine contact a poison control center or emergency room at once. NOTE: This medicine is only for you. Do not share this medicine with others. What if I miss a dose? If you miss a dose, take it as soon as you can unless it is more than 8 hours late. If it is more than 8 hours late, skip the missed dose. Take the next dose at the normal time. Do not take extra or 2 doses at the same time to make up for the missed dose. What may interact with this medication? Do not take this medication with any of the following medications: Alfuzosin Certain medications for anxiety or sleep like midazolam, triazolam Certain medications for cancer like apalutamide, enzalutamide Certain medications for cholesterol like lovastatin, simvastatin Certain medications for irregular heart beat like amiodarone, dronedarone, flecainide, propafenone, quinidine Certain medications for pain like meperidine, piroxicam Certain medications for psychotic disorders like clozapine, lurasidone, pimozide Certain medications for seizures like carbamazepine, phenobarbital, phenytoin Colchicine Eletriptan Eplerenone Ergot alkaloids like dihydroergotamine, ergonovine, ergotamine, methylergonovine Finerenone Flibanserin Ivabradine Lomitapide Naloxegol Ranolazine Rifampin Sildenafil Silodosin St. John's Wort Tolvaptan Ubrogepant Voclosporin This medication may also interact with the following medications: Bedaquiline Birth control pills Bosentan Certain antibiotics like erythromycin or clarithromycin Certain  medications for blood pressure like amlodipine, diltiazem, felodipine, nicardipine, nifedipine Certain medications for cancer like abemaciclib, ceritinib, dasatinib, encorafenib, ibrutinib, ivosidenib, neratinib, nilotinib, venetoclax, vinblastine, vincristine Certain medications for cholesterol like atorvastatin, rosuvastatin Certain medications for depression like bupropion, trazodone Certain medications for fungal infections like isavuconazonium, itraconazole, ketoconazole, voriconazole Certain medications for hepatitis C like elbasvir; grazoprevir, dasabuvir; ombitasvir; paritaprevir; ritonavir, glecaprevir; pibrentasvir, sofosbuvir; velpatasvir; voxilaprevir Certain medications for HIV or AIDS Certain medications for irregular heartbeat like lidocaine Certain medications that treat or prevent blood clots like rivaroxaban, warfarin Digoxin Fentanyl Medications that lower your chance of fighting infection like cyclosporine, sirolimus, tacrolimus Methadone Quetiapine Rifabutin Salmeterol Steroid medications like betamethasone, budesonide, ciclesonide, dexamethasone, fluticasone, methylprednisone, mometasone, triamcinolone This list may not describe all possible interactions. Give your health care provider a list of all the medicines, herbs, non-prescription drugs, or dietary supplements you use. Also tell them if you smoke, drink alcohol, or use illegal drugs. Some items may interact with your medicine. What should I watch for while using this medication? Your condition will be monitored carefully while you are receiving this medication. Visit your care team for regular checkups. Tell your care team if your symptoms do not start to get better or if they get worse. If you have untreated HIV infection, this medication may lead to some HIV medications not working as well in the future. Birth control  may not work properly while you are taking this medication. Talk to your care team about using an  extra method of birth control. What side effects may I notice from receiving this medication? Side effects that you should report to your care team as soon as possible: Allergic reactions--skin rash, itching, hives, swelling of the face, lips, tongue, or throat Liver injury--right upper belly pain, loss of appetite, nausea, light-colored stool, dark yellow or brown urine, yellowing skin or eyes, unusual weakness or fatigue Redness, blistering, peeling, or loosening of the skin, including inside the mouth Side effects that usually do not require medical attention (report these to your care team if they continue or are bothersome): Change in taste Diarrhea General discomfort and fatigue Increase in blood pressure Muscle pain Nausea Stomach pain This list may not describe all possible side effects. Call your doctor for medical advice about side effects. You may report side effects to FDA at 1-800-FDA-1088. Where should I keep my medication? Keep out of the reach of children and pets. Store at room temperature between 20 and 25 degrees C (68 and 77 degrees F). Get rid of any unused medication after the expiration date. To get rid of medications that are no longer needed or have expired: Take the medication to a medication take-back program. Check with your pharmacy or law enforcement to find a location. If you cannot return the medication, check the label or package insert to see if the medication should be thrown out in the garbage or flushed down the toilet. If you are not sure, ask your care team. If it is safe to put it in the trash, take the medication out of the container. Mix the medication with cat litter, dirt, coffee grounds, or other unwanted substance. Seal the mixture in a bag or container. Put it in the trash. NOTE: This sheet is a summary. It may not cover all possible information. If you have questions about this medicine, talk to your doctor, pharmacist, or health care provider.   2022 Elsevier/Gold Standard (2020-12-03 00:00:00)

## 2021-08-15 NOTE — Telephone Encounter (Signed)
Spoke with the patient and scheduled a virtual visit today with Dr Maudie Mercury as PCP is out of the office.

## 2021-08-15 NOTE — Progress Notes (Signed)
Virtual Visit via Video Note  I connected with Samuel Castillo  on 08/15/21 at  3:40 PM EDT by a video enabled telemedicine application and verified that I am speaking with the correct person using two identifiers.  Location patient: [place] Location provider:work or home office Persons participating in the virtual visit: patient, provider  I discussed the limitations and requested verbal permission for telemedicine visit. The patient expressed understanding and agreed to proceed.   HPI:  Acute telemedicine visit for Covid19: -Onset: 3 days ago; positive covid test -daughter is sick too and tested positive -Symptoms include:restless sleep, achy, headache - improving, scratchy throat, fever - low grade -Denies: CP, SOB, VD, inability to tol oral intake -Has tried: emergenC, excedrin -Pertinent past medical history: see below, denies liver or kidney disease, GFR > 60 in 12/2020 -Pertinent medication allergies:No Known Allergies -COVID-19 vaccine status: 2 doses and 1 booster (no bivelent) Immunization History  Administered Date(s) Administered   Influenza,inj,Quad PF,6+ Mos 01/06/2017, 12/04/2017, 12/14/2018, 02/21/2020, 01/10/2021   Moderna Sars-Covid-2 Vaccination 05/01/2019, 05/29/2019, 03/07/2020   Td 05/17/2007   Zoster Recombinat (Shingrix) 12/14/2018, 02/07/2019     ROS: See pertinent positives and negatives per HPI.  Past Medical History:  Diagnosis Date   Hepatitis B infection    resolved, no viral remnants    Hx of adenomatous colonic polyps 2014   Hyperlipidemia    Thyroid disease     Past Surgical History:  Procedure Laterality Date   COLONOSCOPY  04/15/2018   per Dr. Rush Landmark, tubular adenomas, repeat in 3 yrs    TONSILLECTOMY  1970     Current Outpatient Medications:    atorvastatin (LIPITOR) 10 MG tablet, Take 1 tablet (10 mg total) by mouth daily., Disp: 90 tablet, Rfl: 3   levothyroxine (SYNTHROID) 75 MCG tablet, TAKE 1 TABLET(75 MCG) BY MOUTH DAILY, Disp: 90  tablet, Rfl: 1   nirmatrelvir/ritonavir EUA (PAXLOVID) 20 x 150 MG & 10 x '100MG'$  TABS, Take 3 tablets by mouth 2 (two) times daily for 5 days. (Take nirmatrelvir 150 mg two tablets twice daily for 5 days and ritonavir 100 mg one tablet twice daily for 5 days) Patient GFR is > 60 on last check in october, Disp: 30 tablet, Rfl: 0  EXAM:  VITALS per patient if applicable:  GENERAL: alert, oriented, appears well and in no acute distress  HEENT: atraumatic, conjunttiva clear, no obvious abnormalities on inspection of external nose and ears  NECK: normal movements of the head and neck  LUNGS: on inspection no signs of respiratory distress, breathing rate appears normal, no obvious gross SOB, gasping or wheezing  CV: no obvious cyanosis  MS: moves all visible extremities without noticeable abnormality  PSYCH/NEURO: pleasant and cooperative, no obvious depression or anxiety, speech and thought processing grossly intact  ASSESSMENT AND PLAN:  Discussed the following assessment and plan:  COVID-19   Discussed treatment options, side effect and risk of drug interactions, ideal treatment window, potential complications, isolation and precautions for COVID-19.  Discussed possibility of rebound with or without antivirals. Checked for/reviewed last GFR - listed in HPI if available.  After lengthy discussion, the patient opted for treatment with Paxlovid due to being higher risk for complications of covid or severe disease and other factors. Discussed EUA status of this drug and the fact that there is preliminary limited knowledge of risks/interactions/side effects per EUA document vs possible benefits and precautions. This information was shared with patient during the visit and also was provided in patient instructions. Patient agrees to  hold his statin for 8 days.  Other symptomatic care measures summarized in patient instructions. Declined work slip.  Advised to seek prompt virtual visit or in  person care if worsening, new symptoms arise, or if is not improving with treatment as expected per our conversation of expected course. Discussed options for follow up care. Did let this patient know that I do telemedicine on Tuesdays and Thursdays for [facility] and those are the days I am logged into the system. Advised to schedule follow up visit with PCP, [place] virtual visits or [facility] if any further questions or concerns to avoid delays in care.   I discussed the assessment and treatment plan with the patient. The patient was provided an opportunity to ask questions and all were answered. The patient agreed with the plan and demonstrated an understanding of the instructions.     Lucretia Kern, DO

## 2021-08-15 NOTE — Telephone Encounter (Signed)
He would probably benefit from an antiviral medication. Set up a virtual OV so we can discuss this

## 2021-08-21 ENCOUNTER — Encounter: Payer: Self-pay | Admitting: Gastroenterology

## 2021-09-12 ENCOUNTER — Other Ambulatory Visit: Payer: Self-pay | Admitting: Family Medicine

## 2021-09-12 DIAGNOSIS — E039 Hypothyroidism, unspecified: Secondary | ICD-10-CM

## 2021-09-24 ENCOUNTER — Ambulatory Visit (AMBULATORY_SURGERY_CENTER): Payer: 59 | Admitting: *Deleted

## 2021-09-24 VITALS — Ht 73.5 in | Wt 208.0 lb

## 2021-09-24 DIAGNOSIS — Z860100 Personal history of colon polyps, unspecified: Secondary | ICD-10-CM

## 2021-09-24 NOTE — Progress Notes (Signed)
No egg or soy allergy known to patient  No issues known to pt with past sedation with any surgeries or procedures Patient denies ever being told they had issues or difficulty with intubation  No FH of Malignant Hyperthermia Pt is not on diet pills Pt is not on  home 02  Pt is not on blood thinners  Pt denies issues with constipation  No A fib or A flutter Have any cardiac testing pending--no Pt instructed to use Singlecare.com or GoodRx for a price reduction on prep   Sample page of over the counter items to purchase for prep mailed with packet.

## 2021-09-30 ENCOUNTER — Telehealth: Payer: Self-pay | Admitting: *Deleted

## 2021-09-30 ENCOUNTER — Ambulatory Visit (INDEPENDENT_AMBULATORY_CARE_PROVIDER_SITE_OTHER): Payer: 59 | Admitting: *Deleted

## 2021-09-30 DIAGNOSIS — Z23 Encounter for immunization: Secondary | ICD-10-CM | POA: Diagnosis not present

## 2021-09-30 NOTE — Telephone Encounter (Signed)
Noted  

## 2021-09-30 NOTE — Telephone Encounter (Signed)
Yes please give the TDaP

## 2021-09-30 NOTE — Telephone Encounter (Signed)
Patient is on the nurse's visit schedule today at 2:15pm for a Tdap.  Message sent to PCP requesting prior approval.

## 2021-10-18 ENCOUNTER — Encounter: Payer: Self-pay | Admitting: Gastroenterology

## 2021-10-23 ENCOUNTER — Ambulatory Visit (AMBULATORY_SURGERY_CENTER): Payer: 59 | Admitting: Gastroenterology

## 2021-10-23 ENCOUNTER — Encounter: Payer: Self-pay | Admitting: Gastroenterology

## 2021-10-23 VITALS — BP 109/64 | HR 57 | Temp 97.7°F | Resp 15 | Ht 73.5 in | Wt 208.0 lb

## 2021-10-23 DIAGNOSIS — Z09 Encounter for follow-up examination after completed treatment for conditions other than malignant neoplasm: Secondary | ICD-10-CM

## 2021-10-23 DIAGNOSIS — Z860100 Personal history of colon polyps, unspecified: Secondary | ICD-10-CM

## 2021-10-23 DIAGNOSIS — D125 Benign neoplasm of sigmoid colon: Secondary | ICD-10-CM

## 2021-10-23 DIAGNOSIS — K635 Polyp of colon: Secondary | ICD-10-CM

## 2021-10-23 HISTORY — PX: COLONOSCOPY: SHX174

## 2021-10-23 MED ORDER — SODIUM CHLORIDE 0.9 % IV SOLN: 500.0000 mL | Freq: Once | INTRAVENOUS | Status: DC

## 2021-10-23 NOTE — Progress Notes (Signed)
Report to PACU, RN, vss, BBS= Clear.  

## 2021-10-23 NOTE — Progress Notes (Signed)
GASTROENTEROLOGY PROCEDURE H&P NOTE   Primary Care Physician: Laurey Morale, MD  HPI: Samuel Castillo is a 61 y.o. male who presents for Colonoscopy for surveillance of previous adenomatous polyps.  Past Medical History:  Diagnosis Date   Allergy    SLIGHT   Hepatitis B infection    resolved, no viral remnants    Hx of adenomatous colonic polyps 2014   Hyperlipidemia    Hypertension    BORDELINE,NO MEDICATIONS   Thyroid disease    Past Surgical History:  Procedure Laterality Date   COLONOSCOPY  04/15/2018   per Dr. Rush Landmark, tubular adenomas, repeat in 3 yrs    POLYPECTOMY     TONSILLECTOMY  1970   Current Outpatient Medications  Medication Sig Dispense Refill   atorvastatin (LIPITOR) 10 MG tablet Take 1 tablet (10 mg total) by mouth daily. 90 tablet 3   levothyroxine (SYNTHROID) 75 MCG tablet TAKE 1 TABLET(75 MCG) BY MOUTH DAILY 90 tablet 0   Current Facility-Administered Medications  Medication Dose Route Frequency Provider Last Rate Last Admin   0.9 %  sodium chloride infusion  500 mL Intravenous Once Mansouraty, Telford Nab., MD        Current Outpatient Medications:    atorvastatin (LIPITOR) 10 MG tablet, Take 1 tablet (10 mg total) by mouth daily., Disp: 90 tablet, Rfl: 3   levothyroxine (SYNTHROID) 75 MCG tablet, TAKE 1 TABLET(75 MCG) BY MOUTH DAILY, Disp: 90 tablet, Rfl: 0  Current Facility-Administered Medications:    0.9 %  sodium chloride infusion, 500 mL, Intravenous, Once, Mansouraty, Telford Nab., MD No Known Allergies Family History  Problem Relation Age of Onset   Breast cancer Mother    Diabetes Father    Heart disease Father    Leukemia Sister    Healthy Sister    Healthy Sister    Healthy Brother    Colon cancer Neg Hx    Colon polyps Neg Hx    Crohn's disease Neg Hx    Esophageal cancer Neg Hx    Rectal cancer Neg Hx    Stomach cancer Neg Hx    Social History   Socioeconomic History   Marital status: Widowed    Spouse name: Not  on file   Number of children: Not on file   Years of education: Not on file   Highest education level: Not on file  Occupational History   Not on file  Tobacco Use   Smoking status: Never    Passive exposure: Never   Smokeless tobacco: Never  Vaping Use   Vaping Use: Never used  Substance and Sexual Activity   Alcohol use: Yes    Comment: social   Drug use: Not Currently   Sexual activity: Not on file  Other Topics Concern   Not on file  Social History Narrative   Not on file   Social Determinants of Health   Financial Resource Strain: Not on file  Food Insecurity: Not on file  Transportation Needs: Not on file  Physical Activity: Not on file  Stress: Not on file  Social Connections: Not on file  Intimate Partner Violence: Not on file    Physical Exam: Today's Vitals   10/23/21 0815 10/23/21 0821  BP: (!) 143/94   Pulse: 69   Temp: 97.7 F (36.5 C) 97.7 F (36.5 C)  TempSrc: Skin   SpO2: 96%   Weight: 208 lb (94.3 kg)   Height: 6' 1.5" (1.867 m)    Body mass index is  27.07 kg/m. GEN: NAD EYE: Sclerae anicteric ENT: MMM CV: Non-tachycardic GI: Soft, NT/ND NEURO:  Alert & Oriented x 3  Lab Results: No results for input(s): "WBC", "HGB", "HCT", "PLT" in the last 72 hours. BMET No results for input(s): "NA", "K", "CL", "CO2", "GLUCOSE", "BUN", "CREATININE", "CALCIUM" in the last 72 hours. LFT No results for input(s): "PROT", "ALBUMIN", "AST", "ALT", "ALKPHOS", "BILITOT", "BILIDIR", "IBILI" in the last 72 hours. PT/INR No results for input(s): "LABPROT", "INR" in the last 72 hours.   Impression / Plan: This is a 61 y.o.male who presents for Colonoscopy for surveillance of previous adenomatous polyps.  The risks and benefits of endoscopic evaluation/treatment were discussed with the patient and/or family; these include but are not limited to the risk of perforation, infection, bleeding, missed lesions, lack of diagnosis, severe illness requiring  hospitalization, as well as anesthesia and sedation related illnesses.  The patient's history has been reviewed, patient examined, no change in status, and deemed stable for procedure.  The patient and/or family is agreeable to proceed.    [name], MD [facility] Office # [phone]

## 2021-10-23 NOTE — Progress Notes (Signed)
Called to room to assist during endoscopic procedure.  Patient ID and intended procedure confirmed with present staff. Received instructions for my participation in the procedure from the performing physician.  

## 2021-10-23 NOTE — Progress Notes (Signed)
Pt's states no medical or surgical changes since previsit or office visit. 

## 2021-10-23 NOTE — Progress Notes (Signed)
Vs by A Shelton in adm

## 2021-10-23 NOTE — Patient Instructions (Signed)
Handouts on hemorrhoids, diverticulosis, polyps, and high fiber diet given to patient. Await pathology results. Resume previous diet and continue present medications. Recommended to use FiberCon 1-2 tablets daily. Repeat colonoscopy in 7 years most likely for surveillance.  YOU HAD AN ENDOSCOPIC PROCEDURE TODAY AT [facility]:   Refer to the procedure report that was given to you for any specific questions about what was found during the examination.  If the procedure report does not answer your questions, please call your gastroenterologist to clarify.  If you requested that your care partner not be given the details of your procedure findings, then the procedure report has been included in a sealed envelope for you to review at your convenience later.  YOU SHOULD EXPECT: Some feelings of bloating in the abdomen. Passage of more gas than usual.  Walking can help get rid of the air that was put into your GI tract during the procedure and reduce the bloating. If you had a lower endoscopy (such as a colonoscopy or flexible sigmoidoscopy) you may notice spotting of blood in your stool or on the toilet paper. If you underwent a bowel prep for your procedure, you may not have a normal bowel movement for a few days.  Please Note:  You might notice some irritation and congestion in your nose or some drainage.  This is from the oxygen used during your procedure.  There is no need for concern and it should clear up in a day or so.  SYMPTOMS TO REPORT IMMEDIATELY:  Following lower endoscopy (colonoscopy or flexible sigmoidoscopy):  Excessive amounts of blood in the stool  Significant tenderness or worsening of abdominal pains  Swelling of the abdomen that is new, acute  Fever of 100F or higher  For urgent or emergent issues, a gastroenterologist can be reached at any hour by calling [phone]. Do not use MyChart messaging for urgent concerns.    DIET:  We do recommend a small  meal at first, but then you may proceed to your regular diet.  Drink plenty of fluids but you should avoid alcoholic beverages for 24 hours.  ACTIVITY:  You should plan to take it easy for the rest of today and you should NOT DRIVE or use heavy machinery until tomorrow (because of the sedation medicines used during the test).    FOLLOW UP: Our staff will call the number listed on your records the next business day following your procedure.  We will call around 7:15- 8:00 am to check on you and address any questions or concerns that you may have regarding the information given to you following your procedure. If we do not reach you, we will leave a message.  If you develop any symptoms (ie: fever, flu-like symptoms, shortness of breath, cough etc.) before then, please call [phone].  If you test positive for Covid 19 in the 2 weeks post procedure, please call and report this information to [place].    If any biopsies were taken you will be contacted by phone or by letter within the next 1-3 weeks.  Please call us at [phone] if you have not heard about the biopsies in 3 weeks.    SIGNATURES/CONFIDENTIALITY: You and/or your care partner have signed paperwork which will be entered into your electronic medical record.  These signatures attest to the fact that that the information above on your After Visit Summary has been reviewed and is understood.  Full responsibility of the confidentiality of this discharge information lies  with you and/or your care-partner.  

## 2021-10-23 NOTE — Op Note (Signed)
[facility] Patient Name: Samuel Castillo Procedure Date: 10/23/2021 9:03 AM MRN: [id number] Endoscopist: [name] , MD Age: 61 Referring MD:  Date of Birth: [birth year] Gender: Male Account #: [id number] Procedure:                Colonoscopy Indications:              Surveillance: Personal history of adenomatous                            polyps on last colonoscopy 3 years ago Medicines:                Monitored Anesthesia Care Procedure:                Pre-Anesthesia Assessment:                           - Prior to the procedure, a History and Physical                            was performed, and patient medications and                            allergies were reviewed. The patient's tolerance of                            previous anesthesia was also reviewed. The risks                            and benefits of the procedure and the sedation                            options and risks were discussed with the patient.                            All questions were answered, and informed consent                            was obtained. Prior Anticoagulants: The patient has                            taken no previous anticoagulant or antiplatelet                            agents. ASA Grade Assessment: II - A patient with                            mild systemic disease. After reviewing the risks                            and benefits, the patient was deemed in                            satisfactory condition to undergo the procedure.  After obtaining informed consent, the colonoscope                            was passed under direct vision. Throughout the                            procedure, the patient's blood pressure, pulse, and                            oxygen saturations were monitored continuously. The                            Olympus CF-HQ190L [id number]) Colonoscope was                            introduced through the anus and  advanced to the 5                            cm into the ileum. The colonoscopy was performed                            without difficulty. The patient tolerated the                            procedure. The quality of the bowel preparation was                            good. The terminal ileum, ileocecal valve,                            appendiceal orifice, and rectum were photographed. Scope In: 9:13:12 AM Scope Out: 9:25:19 AM Scope Withdrawal Time: 0 hours 7 minutes 56 seconds  Total Procedure Duration: 0 hours 12 minutes 7 seconds  Findings:                 Skin tags were found on perianal exam.                           The digital rectal exam findings include                            hemorrhoids. Pertinent negatives include no                            palpable rectal lesions.                           The terminal ileum and ileocecal valve appeared                            normal.                           A 2 mm polyp was found in the sigmoid colon. The  polyp was sessile. The polyp was removed with a                            cold snare. Resection and retrieval were complete.                           A few small-mouthed diverticula were found in the                            recto-sigmoid colon and sigmoid colon.                           Normal mucosa was found in the entire colon                            otherwise.                           Non-bleeding non-thrombosed internal hemorrhoids                            were found during retroflexion, during perianal                            exam and during digital exam. The hemorrhoids were                            Grade II (internal hemorrhoids that prolapse but                            reduce spontaneously). Complications:            No immediate complications. Estimated Blood Loss:     Estimated blood loss was minimal. Impression:               - Perianal skin tags found on  perianal exam.                           - Hemorrhoids found on digital rectal exam.                           - The examined portion of the ileum was normal.                           - One 2 mm polyp in the sigmoid colon, removed with                            a cold snare. Resected and retrieved.                           - Diverticulosis in the recto-sigmoid colon and in                            the sigmoid colon.                           -  Normal mucosa in the entire examined colon                            otherwise.                           - Non-bleeding non-thrombosed internal hemorrhoids. Recommendation:           - Await pathology results.                           - The patient will be observed post-procedure,                            until all discharge criteria are met.                           - Discharge patient to home.                           - Patient has a contact number available for                            emergencies. The signs and symptoms of potential                            delayed complications were discussed with the                            patient. Return to normal activities tomorrow.                            Written discharge instructions were provided to the                            patient.                           - High fiber diet.                           - Use FiberCon 1-2 tablets PO daily.                           - Continue present medications.                           - Await pathology results.                           - Repeat colonoscopy in 7 years for surveillance no                            matter pathology due to history of previous                            adenomatous polyps.                           -  The findings and recommendations were discussed                            with the patient.                           - The findings and recommendations were discussed                            with the  patient's family. [name], MD 10/23/2021 9:30:24 AM

## 2021-10-24 ENCOUNTER — Telehealth: Payer: Self-pay | Admitting: *Deleted

## 2021-10-24 NOTE — Telephone Encounter (Signed)
  Follow up Call-     10/23/2021    8:21 AM  Call back number  Post procedure Call Back phone  # [phone]  Permission to leave phone message Yes     Patient questions:  Do you have a fever, pain , or abdominal swelling? No. Pain Score  0 *  Have you tolerated food without any problems? Yes.    Have you been able to return to your normal activities? Yes.    Do you have any questions about your discharge instructions: Diet   No. Medications  No. Follow up visit  No.  Do you have questions or concerns about your Care? No.  Actions: * If pain score is 4 or above: No action needed, pain <4.

## 2021-10-25 ENCOUNTER — Encounter: Payer: Self-pay | Admitting: Gastroenterology

## 2021-12-23 ENCOUNTER — Telehealth (INDEPENDENT_AMBULATORY_CARE_PROVIDER_SITE_OTHER): Payer: 59 | Admitting: Family Medicine

## 2021-12-23 ENCOUNTER — Encounter: Payer: Self-pay | Admitting: Family Medicine

## 2021-12-23 VITALS — Ht 73.5 in

## 2021-12-23 DIAGNOSIS — L03031 Cellulitis of right toe: Secondary | ICD-10-CM | POA: Diagnosis not present

## 2021-12-23 MED ORDER — CEPHALEXIN 500 MG PO CAPS: 500.0000 mg | ORAL_CAPSULE | Freq: Three times a day (TID) | ORAL | 0 refills | Status: AC

## 2021-12-23 NOTE — Progress Notes (Signed)
Virtual Visit via Video Note I connected with Samuel Castillo on 12/23/2021 by a video enabled telemedicine application and verified that I am speaking with the correct person using two identifiers.  Location patient: home Location provider:work office Persons participating in the virtual visit: patient, provider  I discussed the limitations of evaluation and management by telemedicine and the availability of in person appointments. The patient expressed understanding and agreed to proceed.  Chief Complaint  Patient presents with   infected toenail   HPI: Samuel Castillo is a 61 year old male with history of hypothyroidism, hypertension, and hyperlipidemia complaining of 2 to 3 days of right great toe periungual erythema,pain,and edema. This started a days or 2 after he cut his toenail deep and started ":digging" around the corner of toenail. Pain is intermittent, sharp, max 6/10, exacerbated by being barefoot. Wearing sock and shoe seems to help with pain.  Negative for fever, chills, unusual fatigue, abdominal pain, nausea, myalgias, arthralgias, numbness, tingling, or weakness. He has not used OTC medications. Problem is getting worse.  ROS: See pertinent positives and negatives per HPI.  Past Medical History:  Diagnosis Date   Allergy    SLIGHT   Hepatitis B infection    resolved, no viral remnants    Hx of adenomatous colonic polyps 2014   Hyperlipidemia    Hypertension    BORDELINE,NO MEDICATIONS   Thyroid disease    Past Surgical History:  Procedure Laterality Date   COLONOSCOPY  04/15/2018   per Dr. Rush Landmark, tubular adenomas, repeat in 3 yrs    POLYPECTOMY     TONSILLECTOMY  1970   Family History  Problem Relation Age of Onset   Breast cancer Mother    Diabetes Father    Heart disease Father    Leukemia Sister    Healthy Sister    Healthy Sister    Healthy Brother    Colon cancer Neg Hx    Colon polyps Neg Hx    Crohn's disease Neg Hx    Esophageal cancer Neg Hx     Rectal cancer Neg Hx    Stomach cancer Neg Hx    Social History   Socioeconomic History   Marital status: Widowed    Spouse name: Not on file   Number of children: Not on file   Years of education: Not on file   Highest education level: Not on file  Occupational History   Not on file  Tobacco Use   Smoking status: Never    Passive exposure: Never   Smokeless tobacco: Never  Vaping Use   Vaping Use: Never used  Substance and Sexual Activity   Alcohol use: Yes    Comment: social   Drug use: Not Currently   Sexual activity: Not on file  Other Topics Concern   Not on file  Social History Narrative   Not on file   Social Determinants of Health   Financial Resource Strain: Not on file  Food Insecurity: Not on file  Transportation Needs: Not on file  Physical Activity: Not on file  Stress: Not on file  Social Connections: Not on file  Intimate Partner Violence: Not on file   Current Outpatient Medications:    atorvastatin (LIPITOR) 10 MG tablet, Take 1 tablet (10 mg total) by mouth daily., Disp: 90 tablet, Rfl: 3   levothyroxine (SYNTHROID) 75 MCG tablet, TAKE 1 TABLET(75 MCG) BY MOUTH DAILY, Disp: 90 tablet, Rfl: 0  EXAM:  VITALS per patient if applicable:Ht 6' 1.5" (8.546 m)  BMI 27.07 kg/m   GENERAL: alert, oriented, appears well and in no acute distress  HEENT: atraumatic, conjunctiva clear, no obvious abnormalities on inspection.  NECK: normal movements of the head and neck  LUNGS: on inspection no signs of respiratory distress, breathing rate appears normal, no obvious gross SOB, gasping or wheezing  CV: no obvious cyanosis  SKIN: medial periungual area of right great toe with edema,erythema,and yellowish crust. No active drainage appreciated and no necrotic tissue. Movement does not aggravate pain.  MS: moves all visible extremities without noticeable abnormality  PSYCH/NEURO: pleasant and cooperative, no obvious depression or anxiety, speech and  thought processing grossly intact  ASSESSMENT AND PLAN:  Discussed the following assessment and plan:  Paronychia of great toe, right - Plan: cephALEXin (KEFLEX) 500 MG capsule We discussed diagnosis and treatment options. We will start empiric treatment with cephalexin 500 mg 3 times daily for 7 days. Instructed to soak right foot in warm water with Epsom salt for 10 to 15 minutes then gently push cuticle away from toenail with a Qtip. He can do this daily until problem resolves.  Instructed about warning signs. Follow-up with PCP as needed.  We discussed possible serious and likely etiologies, options for evaluation and workup, limitations of telemedicine visit vs in person visit, treatment, treatment risks and precautions. The patient was advised to call back or seek an in-person evaluation if the symptoms worsen or if the condition fails to improve as anticipated. I discussed the assessment and treatment plan with the patient. The patient was provided an opportunity to ask questions and all were answered. The patient agreed with the plan and demonstrated an understanding of the instructions.  Taline Nass G. [place], MD  [facility]. [place] office.

## 2021-12-27 ENCOUNTER — Ambulatory Visit (INDEPENDENT_AMBULATORY_CARE_PROVIDER_SITE_OTHER): Payer: 59

## 2021-12-27 DIAGNOSIS — Z23 Encounter for immunization: Secondary | ICD-10-CM

## 2022-01-06 ENCOUNTER — Other Ambulatory Visit (HOSPITAL_BASED_OUTPATIENT_CLINIC_OR_DEPARTMENT_OTHER): Payer: Self-pay

## 2022-01-06 MED ORDER — COMIRNATY 30 MCG/0.3ML IM SUSY
PREFILLED_SYRINGE | INTRAMUSCULAR | 0 refills | Status: DC
  Filled 2022-01-06: qty 0.3, 1d supply, fill #0

## 2022-01-14 ENCOUNTER — Encounter: Payer: Self-pay | Admitting: Family Medicine

## 2022-01-15 ENCOUNTER — Encounter: Payer: Self-pay | Admitting: Family Medicine

## 2022-01-15 ENCOUNTER — Ambulatory Visit: Payer: 59 | Admitting: Family Medicine

## 2022-01-15 VITALS — BP 120/82 | HR 56 | Temp 97.0°F | Ht 73.5 in | Wt 215.0 lb

## 2022-01-15 DIAGNOSIS — M722 Plantar fascial fibromatosis: Secondary | ICD-10-CM

## 2022-01-15 MED ORDER — DICLOFENAC SODIUM 75 MG PO TBEC: 75.0000 mg | DELAYED_RELEASE_TABLET | Freq: Two times a day (BID) | ORAL | 0 refills | Status: DC

## 2022-01-15 NOTE — Progress Notes (Signed)
   Subjective:    Patient ID: Samuel Castillo, male    DOB: 19-Jun-1960, 61 y.o.   MRN: [id number]  HPI Here for 3 weeks of pain in the bottom of the right heel. No recent trauma. He is on his feet all day at his job, and he typically wears leather dress shoes.    Review of Systems  Constitutional: Negative.   Respiratory: Negative.    Cardiovascular: Negative.   Musculoskeletal:  Positive for arthralgias.       Objective:   Physical Exam Constitutional:      Appearance: Normal appearance.     Comments: Walks normally   Cardiovascular:     Rate and Rhythm: Normal rate and regular rhythm.     Pulses: Normal pulses.     Heart sounds: Normal heart sounds.  Pulmonary:     Effort: Pulmonary effort is normal.     Breath sounds: Normal breath sounds.  Musculoskeletal:     Comments: Right foot appears normal. He is tender at the inferior portion of the heel   Neurological:     Mental Status: He is alert.           Assessment & Plan:  Plantar fasciitis. He will stay off his feet as much as possible for a few weeks. Wear shoes with a good arch support. Apply ice 3 times daily. Take Diclofenac BID. Recheck as needed.  Alysia Penna, MD

## 2022-01-22 ENCOUNTER — Encounter: Payer: 59 | Admitting: Family Medicine

## 2022-01-29 ENCOUNTER — Ambulatory Visit (INDEPENDENT_AMBULATORY_CARE_PROVIDER_SITE_OTHER): Payer: 59 | Admitting: Family Medicine

## 2022-01-29 ENCOUNTER — Encounter: Payer: Self-pay | Admitting: Family Medicine

## 2022-01-29 VITALS — BP 118/78 | HR 59 | Temp 97.9°F | Ht 73.5 in | Wt 214.2 lb

## 2022-01-29 DIAGNOSIS — E039 Hypothyroidism, unspecified: Secondary | ICD-10-CM

## 2022-01-29 DIAGNOSIS — Z0000 Encounter for general adult medical examination without abnormal findings: Secondary | ICD-10-CM | POA: Diagnosis not present

## 2022-01-29 MED ORDER — LEVOTHYROXINE SODIUM 75 MCG PO TABS: 75.0000 ug | ORAL_TABLET | Freq: Every day | ORAL | 3 refills | Status: DC

## 2022-01-29 MED ORDER — DICLOFENAC SODIUM 75 MG PO TBEC: 75.0000 mg | DELAYED_RELEASE_TABLET | Freq: Two times a day (BID) | ORAL | 3 refills | Status: DC

## 2022-01-29 MED ORDER — ATORVASTATIN CALCIUM 10 MG PO TABS: 10.0000 mg | ORAL_TABLET | Freq: Every day | ORAL | 3 refills | Status: DC

## 2022-01-29 NOTE — Progress Notes (Signed)
   Subjective:    Patient ID: Samuel Castillo, male    DOB: 29-Jun-1960, 61 y.o.   MRN: [id number]  HPI Here for a well exam. He feels fine. He plans to retire after the first of the year.    Review of Systems  Constitutional: Negative.   HENT: Negative.    Eyes: Negative.   Respiratory: Negative.    Cardiovascular: Negative.   Gastrointestinal: Negative.   Genitourinary: Negative.   Musculoskeletal: Negative.   Skin: Negative.   Neurological: Negative.   Psychiatric/Behavioral: Negative.         Objective:   Physical Exam Constitutional:      General: He is not in acute distress.    Appearance: Normal appearance. He is well-developed. He is not diaphoretic.  HENT:     Head: Normocephalic and atraumatic.     Right Ear: External ear normal.     Left Ear: External ear normal.     Nose: Nose normal.     Mouth/Throat:     Pharynx: No oropharyngeal exudate.  Eyes:     General: No scleral icterus.       Right eye: No discharge.        Left eye: No discharge.     Conjunctiva/sclera: Conjunctivae normal.     Pupils: Pupils are equal, round, and reactive to light.  Neck:     Thyroid: No thyromegaly.     Vascular: No JVD.     Trachea: No tracheal deviation.  Cardiovascular:     Rate and Rhythm: Normal rate and regular rhythm.     Heart sounds: Normal heart sounds. No murmur heard.    No friction rub. No gallop.  Pulmonary:     Effort: Pulmonary effort is normal. No respiratory distress.     Breath sounds: Normal breath sounds. No wheezing or rales.  Chest:     Chest wall: No tenderness.  Abdominal:     General: Bowel sounds are normal. There is no distension.     Palpations: Abdomen is soft. There is no mass.     Tenderness: There is no abdominal tenderness. There is no guarding or rebound.  Genitourinary:    Penis: Normal. No tenderness.      Testes: Normal.     Prostate: Normal.     Rectum: Normal. Guaiac result negative.  Musculoskeletal:        General: No  tenderness. Normal range of motion.     Cervical back: Neck supple.  Lymphadenopathy:     Cervical: No cervical adenopathy.  Skin:    General: Skin is warm and dry.     Coloration: Skin is not pale.     Findings: No erythema or rash.  Neurological:     Mental Status: He is alert and oriented to person, place, and time.     Cranial Nerves: No cranial nerve deficit.     Motor: No abnormal muscle tone.     Coordination: Coordination normal.     Deep Tendon Reflexes: Reflexes are normal and symmetric. Reflexes normal.  Psychiatric:        Behavior: Behavior normal.        Thought Content: Thought content normal.        Judgment: Judgment normal.           Assessment & Plan:  Well exam. We discussed diet and exercise. Get fasting labs. Alysia Penna, MD

## 2022-01-30 LAB — CBC WITH DIFFERENTIAL/PLATELET
Basophils Absolute: 0.1 10*3/uL (ref 0.0–0.1)
Basophils Relative: 1.2 % (ref 0.0–3.0)
Eosinophils Absolute: 0.2 10*3/uL (ref 0.0–0.7)
Eosinophils Relative: 3 % (ref 0.0–5.0)
HCT: 44 % (ref 39.0–52.0)
Hemoglobin: 15.3 g/dL (ref 13.0–17.0)
Lymphocytes Relative: 25.1 % (ref 12.0–46.0)
Lymphs Abs: 1.4 10*3/uL (ref 0.7–4.0)
MCHC: 34.9 g/dL (ref 30.0–36.0)
MCV: 85.2 fl (ref 78.0–100.0)
Monocytes Absolute: 0.4 10*3/uL (ref 0.1–1.0)
Monocytes Relative: 6.7 % (ref 3.0–12.0)
Neutro Abs: 3.5 10*3/uL (ref 1.4–7.7)
Neutrophils Relative %: 64 % (ref 43.0–77.0)
Platelets: 152 10*3/uL (ref 150.0–400.0)
RBC: 5.16 Mil/uL (ref 4.22–5.81)
RDW: 12.8 % (ref 11.5–15.5)
WBC: 5.5 10*3/uL (ref 4.0–10.5)

## 2022-01-30 LAB — LIPID PANEL
Cholesterol: 155 mg/dL (ref 0–200)
HDL: 49.7 mg/dL (ref >39.00)
LDL Cholesterol: 89 mg/dL (ref 0–99)
NonHDL: 105.17
Total CHOL/HDL Ratio: 3
Triglycerides: 83 mg/dL (ref 0.0–149.0)
VLDL: 16.6 mg/dL (ref 0.0–40.0)

## 2022-01-30 LAB — T4, FREE: Free T4: 1 ng/dL (ref 0.60–1.60)

## 2022-01-30 LAB — HEMOGLOBIN A1C: Hgb A1c MFr Bld: 5.7 % (ref 4.6–6.5)

## 2022-01-30 LAB — HEPATIC FUNCTION PANEL
ALT: 18 U/L (ref 0–53)
AST: 21 U/L (ref 0–37)
Albumin: 4.6 g/dL (ref 3.5–5.2)
Alkaline Phosphatase: 43 U/L (ref 39–117)
Bilirubin, Direct: 0.3 mg/dL (ref 0.0–0.3)
Total Bilirubin: 1.4 mg/dL — ABNORMAL HIGH (ref 0.2–1.2)
Total Protein: 6.9 g/dL (ref 6.0–8.3)

## 2022-01-30 LAB — BASIC METABOLIC PANEL
BUN: 21 mg/dL (ref 6–23)
CO2: 32 mEq/L (ref 19–32)
Calcium: 9.6 mg/dL (ref 8.4–10.5)
Chloride: 101 mEq/L (ref 96–112)
Creatinine, Ser: 1.17 mg/dL (ref 0.40–1.50)
GFR: 66.34 mL/min (ref >60.00)
Glucose, Bld: 82 mg/dL (ref 70–99)
Potassium: 4.1 mEq/L (ref 3.5–5.1)
Sodium: 140 mEq/L (ref 135–145)

## 2022-01-30 LAB — PSA: PSA: 0.68 ng/mL (ref 0.10–4.00)

## 2022-01-30 LAB — T3, FREE: T3, Free: 3.3 pg/mL (ref 2.3–4.2)

## 2022-01-30 LAB — TSH: TSH: 2.01 u[IU]/mL (ref 0.35–5.50)

## 2022-02-11 ENCOUNTER — Encounter: Payer: 59 | Admitting: Family Medicine

## 2022-03-18 ENCOUNTER — Encounter: Payer: Self-pay | Admitting: Family Medicine

## 2022-03-18 ENCOUNTER — Other Ambulatory Visit: Payer: Self-pay | Admitting: Family Medicine

## 2022-03-18 DIAGNOSIS — E039 Hypothyroidism, unspecified: Secondary | ICD-10-CM

## 2022-05-11 ENCOUNTER — Encounter: Payer: Self-pay | Admitting: Family Medicine

## 2022-05-11 DIAGNOSIS — R200 Anesthesia of skin: Secondary | ICD-10-CM

## 2022-05-13 NOTE — Telephone Encounter (Signed)
I did the referral to [facility]

## 2022-05-14 ENCOUNTER — Ambulatory Visit: Payer: 59 | Admitting: Podiatry

## 2022-05-14 DIAGNOSIS — M792 Neuralgia and neuritis, unspecified: Secondary | ICD-10-CM | POA: Diagnosis not present

## 2022-05-14 DIAGNOSIS — G589 Mononeuropathy, unspecified: Secondary | ICD-10-CM

## 2022-05-14 NOTE — Progress Notes (Signed)
  Subjective:  Patient ID: Samuel Castillo, male    DOB: 1960/12/16,  MRN: [id number]  Chief Complaint  Patient presents with   Numbness    Pt stated that he has a numbness in his toes on the right foot mainly when he takes his socks off or laying in bed     62 y.o. male presents with the above complaint.  Patient presents with complaint of right pain to the dorsum of the foot.  Patient states that his numbness in all of his toes.  He notices mainly when he takes his socks off and is laying in bed.  He has not seen anyone else prior to seeing me for this.  The numbness started get more bothersome.  He denies any other acute complaints no pain.   Review of Systems: Negative except as noted in the HPI. Denies N/V/F/Ch.  Past Medical History:  Diagnosis Date   Allergy    SLIGHT   Hepatitis B infection    resolved, no viral remnants    Hx of adenomatous colonic polyps 2014   Hyperlipidemia    Hypertension    BORDELINE,NO MEDICATIONS   Thyroid disease     Current Outpatient Medications:    atorvastatin (LIPITOR) 10 MG tablet, Take 1 tablet (10 mg total) by mouth daily., Disp: 90 tablet, Rfl: 3   diclofenac (VOLTAREN) 75 MG EC tablet, Take 1 tablet (75 mg total) by mouth 2 (two) times daily., Disp: 180 tablet, Rfl: 3   levothyroxine (SYNTHROID) 75 MCG tablet, Take 1 tablet (75 mcg total) by mouth daily before breakfast., Disp: 90 tablet, Rfl: 3  Social History   Tobacco Use  Smoking Status Never   Passive exposure: Never  Smokeless Tobacco Never    No Known Allergies Objective:  There were no vitals filed for this visit. There is no height or weight on file to calculate BMI. Constitutional Well developed. Well nourished.  Vascular Dorsalis pedis pulses palpable bilaterally. Posterior tibial pulses palpable bilaterally. Capillary refill normal to all digits.  No cyanosis or clubbing noted. Pedal hair growth normal.  Neurologic Normal speech. Oriented to person, place, and  time. Epicritic sensation to light touch grossly present bilaterally.  Dermatologic Positive Tinel's sign to superficial peroneal nerve likely due to compression.  Mild tarsal tunnel syndrome noted.  No abnormalities noted no open wounds or lesion noted  Orthopedic: Normal joint ROM without pain or crepitus bilaterally. No visible deformities. No bony tenderness.   Radiographs: None Assessment:   1. Neuritis   2. Nerve compression syndrome    Plan:  Patient was evaluated and treated and all questions answered.  Right superficial peroneal nerve compression/neuritis -All questions and concerns were discussed with the patient extensive detail.  I believe this is likely due to shoes with the tongue of the shoe rubbing against the dorsum of his foot.  I discussed this with patient he states understanding will make shoe gear modification. -If there is no improvement we will discuss nerve block for official diagnosis.  He states understanding  No follow-ups on file.  Right superficial peroneal nerve compression shoe gear modification wider toebox shoes new balance

## 2022-11-19 ENCOUNTER — Encounter: Payer: Self-pay | Admitting: Family Medicine

## 2022-11-28 ENCOUNTER — Ambulatory Visit (INDEPENDENT_AMBULATORY_CARE_PROVIDER_SITE_OTHER): Payer: 59

## 2022-11-28 DIAGNOSIS — Z23 Encounter for immunization: Secondary | ICD-10-CM

## 2022-12-03 ENCOUNTER — Encounter: Payer: Self-pay | Admitting: Podiatry

## 2022-12-03 ENCOUNTER — Ambulatory Visit (INDEPENDENT_AMBULATORY_CARE_PROVIDER_SITE_OTHER): Payer: 59 | Admitting: Podiatry

## 2022-12-03 DIAGNOSIS — R202 Paresthesia of skin: Secondary | ICD-10-CM

## 2022-12-03 DIAGNOSIS — Q666 Other congenital valgus deformities of feet: Secondary | ICD-10-CM

## 2022-12-03 DIAGNOSIS — R200 Anesthesia of skin: Secondary | ICD-10-CM | POA: Diagnosis not present

## 2022-12-03 NOTE — Progress Notes (Signed)
Subjective:  Patient ID: Samuel Castillo, male    DOB: 1961/02/07,  MRN: [id number]  Chief Complaint  Patient presents with   Numbness    Numbness and tingling in toes     62 y.o. male presents with the above complaint.  Patient presents with complaint of right pain to the dorsum of the foot.  Patient states the numbness tingling is still in the toes.  He has tried all the modification that we discussed last time nothing has helped.  He would like to discuss next treatment plan   Review of Systems: Negative except as noted in the HPI. Denies N/V/F/Ch.  Past Medical History:  Diagnosis Date   Allergy    SLIGHT   Hepatitis B infection    resolved, no viral remnants    Hx of adenomatous colonic polyps 2014   Hyperlipidemia    Hypertension    BORDELINE,NO MEDICATIONS   Thyroid disease     Current Outpatient Medications:    atorvastatin (LIPITOR) 10 MG tablet, Take 1 tablet (10 mg total) by mouth daily., Disp: 90 tablet, Rfl: 3   levothyroxine (SYNTHROID) 75 MCG tablet, Take 1 tablet (75 mcg total) by mouth daily before breakfast., Disp: 90 tablet, Rfl: 3  Social History   Tobacco Use  Smoking Status Never   Passive exposure: Never  Smokeless Tobacco Never    No Known Allergies Objective:  There were no vitals filed for this visit. There is no height or weight on file to calculate BMI. Constitutional Well developed. Well nourished.  Vascular Dorsalis pedis pulses palpable bilaterally. Posterior tibial pulses palpable bilaterally. Capillary refill normal to all digits.  No cyanosis or clubbing noted. Pedal hair growth normal.  Neurologic Normal speech. Oriented to person, place, and time. Epicritic sensation to light touch grossly present bilaterally.  Dermatologic Positive Tinel's sign to superficial peroneal nerve likely due to compression.  Mild tarsal tunnel syndrome noted.  No abnormalities noted no open wounds or lesion noted  Orthopedic: Normal joint ROM without  pain or crepitus bilaterally. No visible deformities. No bony tenderness.   Radiographs: None Assessment:   1. Numbness and tingling of foot   2. Pes planovalgus    Plan:  Patient was evaluated and treated and all questions answered.  Right superficial peroneal nerve compression/neuritis -All questions and concerns were discussed with the patient extensive detail.  Clinically patient still continues to have the same issue.  He is tried shoe gear modification at this time patient will benefit from nerve conduction study nerve conduction study was scheduled  Pes planovalgus -I explained to patient the etiology of pes planovalgus and relationship with neuritis and various treatment options were discussed.  Given patient foot structure in the setting of neuritis I believe patient will benefit from custom-made orthotics to help control the hindfoot motion support the arch of the foot and take the stress away from neuritis patient agrees with the plan like to proceed with orthotics -Patient was casted for orthotics

## 2022-12-04 ENCOUNTER — Other Ambulatory Visit (HOSPITAL_BASED_OUTPATIENT_CLINIC_OR_DEPARTMENT_OTHER): Payer: Self-pay

## 2022-12-04 MED ORDER — COMIRNATY 30 MCG/0.3ML IM SUSY
0.3000 mL | PREFILLED_SYRINGE | Freq: Once | INTRAMUSCULAR | 0 refills | Status: AC
  Filled 2022-12-04: qty 0.3, 1d supply, fill #0

## 2022-12-07 ENCOUNTER — Encounter: Payer: Self-pay | Admitting: Family Medicine

## 2022-12-15 ENCOUNTER — Ambulatory Visit: Payer: 59

## 2022-12-15 NOTE — Progress Notes (Signed)
Orthotic order placed items to be fit when in  [employer], CFo, CFm

## 2022-12-23 ENCOUNTER — Other Ambulatory Visit: Payer: Self-pay | Admitting: Podiatry

## 2022-12-23 ENCOUNTER — Encounter: Payer: Self-pay | Admitting: Podiatry

## 2022-12-23 DIAGNOSIS — R200 Anesthesia of skin: Secondary | ICD-10-CM

## 2022-12-24 ENCOUNTER — Encounter: Payer: Self-pay | Admitting: Neurology

## 2022-12-25 ENCOUNTER — Encounter: Payer: Self-pay | Admitting: Family Medicine

## 2023-01-01 NOTE — Telephone Encounter (Signed)
Cancel the Atorvastatin and call in Rosuvastatin 10 mg daily, #90 with 3 rf

## 2023-01-05 MED ORDER — ROSUVASTATIN CALCIUM 10 MG PO TABS: 10.0000 mg | ORAL_TABLET | Freq: Every day | ORAL | 3 refills | Status: DC

## 2023-01-05 NOTE — Telephone Encounter (Signed)
Pt calling to check on progress of this refill request

## 2023-01-19 ENCOUNTER — Ambulatory Visit: Payer: 59

## 2023-01-19 DIAGNOSIS — Q666 Other congenital valgus deformities of feet: Secondary | ICD-10-CM | POA: Diagnosis not present

## 2023-01-19 NOTE — Progress Notes (Signed)
Patient presents today to pick up custom molded foot orthotics, diagnosed with Neuritis, Numbness and tingling, Pes Planus by Dr. Allena Katz.   Orthotics were dispensed and fit was satisfactory. Reviewed instructions for break-in and wear. Written instructions given to patient.  Patient will follow up as needed.  Addison Bailey Cped, CFo, CFm

## 2023-02-02 ENCOUNTER — Ambulatory Visit: Payer: 59 | Admitting: Neurology

## 2023-02-02 ENCOUNTER — Ambulatory Visit: Payer: 59 | Admitting: Family Medicine

## 2023-02-02 ENCOUNTER — Encounter: Payer: Self-pay | Admitting: Family Medicine

## 2023-02-02 VITALS — BP 110/78 | HR 80 | Temp 98.4°F | Ht 73.5 in | Wt 208.0 lb

## 2023-02-02 DIAGNOSIS — M5417 Radiculopathy, lumbosacral region: Secondary | ICD-10-CM

## 2023-02-02 DIAGNOSIS — Z0000 Encounter for general adult medical examination without abnormal findings: Secondary | ICD-10-CM

## 2023-02-02 DIAGNOSIS — E039 Hypothyroidism, unspecified: Secondary | ICD-10-CM | POA: Diagnosis not present

## 2023-02-02 DIAGNOSIS — R202 Paresthesia of skin: Secondary | ICD-10-CM | POA: Diagnosis not present

## 2023-02-02 DIAGNOSIS — R200 Anesthesia of skin: Secondary | ICD-10-CM

## 2023-02-02 LAB — CBC WITH DIFFERENTIAL/PLATELET
Basophils Absolute: 0.1 10*3/uL (ref 0.0–0.1)
Basophils Relative: 0.9 % (ref 0.0–3.0)
Eosinophils Absolute: 0.2 10*3/uL (ref 0.0–0.7)
Eosinophils Relative: 2.2 % (ref 0.0–5.0)
HCT: 45 % (ref 39.0–52.0)
Hemoglobin: 15.5 g/dL (ref 13.0–17.0)
Lymphocytes Relative: 13.6 % (ref 12.0–46.0)
Lymphs Abs: 1.2 10*3/uL (ref 0.7–4.0)
MCHC: 34.5 g/dL (ref 30.0–36.0)
MCV: 85.4 fL (ref 78.0–100.0)
Monocytes Absolute: 0.5 10*3/uL (ref 0.1–1.0)
Monocytes Relative: 6.2 % (ref 3.0–12.0)
Neutro Abs: 6.8 10*3/uL (ref 1.4–7.7)
Neutrophils Relative %: 77.1 % — ABNORMAL HIGH (ref 43.0–77.0)
Platelets: 183 10*3/uL (ref 150.0–400.0)
RBC: 5.26 Mil/uL (ref 4.22–5.81)
RDW: 12.8 % (ref 11.5–15.5)
WBC: 8.8 10*3/uL (ref 4.0–10.5)

## 2023-02-02 LAB — T4, FREE: Free T4: 1.05 ng/dL (ref 0.60–1.60)

## 2023-02-02 LAB — HEMOGLOBIN A1C: Hgb A1c MFr Bld: 5.6 % (ref 4.6–6.5)

## 2023-02-02 LAB — T3, FREE: T3, Free: 3.7 pg/mL (ref 2.3–4.2)

## 2023-02-02 LAB — PSA: PSA: 0.93 ng/mL (ref 0.10–4.00)

## 2023-02-02 LAB — TSH: TSH: 1.82 u[IU]/mL (ref 0.35–5.50)

## 2023-02-02 NOTE — Procedures (Signed)
[facility]  [address], [address]  [place], [place] [address] Tel: [phone] Fax: [phone] Test Date:  02/02/2023  Patient: Samuel Castillo DOB: 04-May-1960 Physician: Jacquelyne Balint, MD  Sex: Male Height: 6' 1.5" Ref Phys: Nicholes Rough, DPM  ID#: [id number]   Technician:    History: This is a 62 year old male with numbness and tingling in feet.  NCV & EMG Findings: Extensive electrodiagnostic evaluation of the right lower limb with additional nerve conduction studies in the left lower limb shows: Right sural, superficial peroneal, and medial plantar sensory responses are within normal limits. Right tibial (AH) motor response shows reduced amplitude (1.12 mV). Right peroneal/fibular (EDB) and left tibial (AH) motor responses are within normal limits. Right H reflex latency is mildly prolonged (38.0 ms), even when corrected for patient's height (normal < 37.5 ms). Chronic motor axon loss changes without accompanying active denervation changes in the right tibialis anterior, flexor digitorum longus, medial head of gastrocnemius, short head of biceps femoris, and gluteus medius muscles. Right lumbosacral paraspinal (L5 level) muscle is within normal limits.  Impression: This is an abnormal study. 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 No electrodiagnostic evidence of a large fiber sensorimotor neuropathy. No electrodiagnostic evidence of right tarsal tunnel syndrome.    ___________________________ Jacquelyne Balint, MD    Nerve Conduction Studies Motor Nerve Results    Latency Amplitude F-Lat Segment Distance CV Comment  Site (ms) Norm (mV) Norm (ms)  (cm) (m/s) Norm   Right Fibular (EDB) Motor  Ankle 4.0  < 6.0 4.5  > 2.5        Bel fib head 12.8 - 3.1 -  Bel fib head-Ankle 35 40  > 40   Pop fossa  15.0 - 2.9 -  Pop fossa-Bel fib head 10 45 -   Left Tibial (AH) Motor  Ankle 4.4  < 6.0 5.0  > 4.0        Right Tibial (AH) Motor  Ankle 4.8  < 6.0 *1.12  > 4.0        Knee 14.3 - 0.96 -  Knee-Ankle 48 51  > 40    Sensory Sites    Neg Peak Lat Amplitude (O-P) Segment Distance Velocity Comment  Site (ms) Norm (V) Norm  (cm) (ms)   Right Medial Plantar (Ortho) Sensory  Great toe-Med mall 3.7 - 7 - Great toe-Med mall -    Right Superficial Fibular Sensory  14 cm-Ankle 2.5  < 4.6 3  > 3 14 cm-Ankle 14    Right Sural Sensory  Calf-Lat mall 3.4  < 4.6 5  > 3 Calf-Lat mall 12     H-Reflex Results    M-Lat H Lat H Neg Amp H-M Lat  Site (ms) (ms) Norm (mV) (ms)  Right Tibial H-Reflex  Pop fossa 8.0 *38.0  < 37.5 4.1 30.0   Electromyography   Side Muscle Ins.Act Fibs Fasc Recrt Amp Dur Poly Activation Comment  Right Tib ant Nml Nml Nml *2- *1+ *1+ *1+ Nml N/A  Right Gastroc MH Nml Nml Nml *2- *1+ *1+ Nml Nml N/A  Right FDL Nml Nml Nml *2- *1+ *1+ Nml Nml N/A  Right Rectus fem Nml Nml Nml Nml Nml Nml Nml Nml N/A  Right Biceps fem SH Nml Nml Nml *1- *1+ *1+ Nml Nml N/A  Right Gluteus med Nml Nml Nml *1- *1+ *1+ Nml Nml N/A  Right Lumbar PSP lower Nml Nml Nml Nml Nml Nml Nml Nml N/A      Waveforms:  Motor        Sensory        H-Reflex

## 2023-02-02 NOTE — Progress Notes (Signed)
Subjective:    Patient ID: Samuel Castillo, male    DOB: 1961-03-01, 62 y.o.   MRN: [id number]  HPI Here for a well exam. He feels well in general. He has officially retired, but he still works part time for his company.    Review of Systems  Constitutional: Negative.   HENT: Negative.    Eyes: Negative.   Respiratory: Negative.    Cardiovascular: Negative.   Gastrointestinal: Negative.   Genitourinary: Negative.   Musculoskeletal: Negative.   Skin: Negative.   Neurological: Negative.   Psychiatric/Behavioral: Negative.         Objective:   Physical Exam Constitutional:      General: He is not in acute distress.    Appearance: Normal appearance. He is well-developed. He is not diaphoretic.  HENT:     Head: Normocephalic and atraumatic.     Right Ear: External ear normal.     Left Ear: External ear normal.     Nose: Nose normal.     Mouth/Throat:     Pharynx: No oropharyngeal exudate.  Eyes:     General: No scleral icterus.       Right eye: No discharge.        Left eye: No discharge.     Conjunctiva/sclera: Conjunctivae normal.     Pupils: Pupils are equal, round, and reactive to light.  Neck:     Thyroid: No thyromegaly.     Vascular: No JVD.     Trachea: No tracheal deviation.  Cardiovascular:     Rate and Rhythm: Normal rate and regular rhythm.     Pulses: Normal pulses.     Heart sounds: Normal heart sounds. No murmur heard.    No friction rub. No gallop.  Pulmonary:     Effort: Pulmonary effort is normal. No respiratory distress.     Breath sounds: Normal breath sounds. No wheezing or rales.  Chest:     Chest wall: No tenderness.  Abdominal:     General: Bowel sounds are normal. There is no distension.     Palpations: Abdomen is soft. There is no mass.     Tenderness: There is no abdominal tenderness. There is no guarding or rebound.  Genitourinary:    Penis: Normal. No tenderness.      Testes: Normal.     Prostate: Normal.     Rectum: Normal.  Guaiac result negative.  Musculoskeletal:        General: No tenderness. Normal range of motion.     Cervical back: Neck supple.  Lymphadenopathy:     Cervical: No cervical adenopathy.  Skin:    General: Skin is warm and dry.     Coloration: Skin is not pale.     Findings: No erythema or rash.  Neurological:     General: No focal deficit present.     Mental Status: He is alert and oriented to person, place, and time.     Cranial Nerves: No cranial nerve deficit.     Motor: No abnormal muscle tone.     Coordination: Coordination normal.     Deep Tendon Reflexes: Reflexes are normal and symmetric. Reflexes normal.  Psychiatric:        Mood and Affect: Mood normal.        Behavior: Behavior normal.        Thought Content: Thought content normal.        Judgment: Judgment normal.           Assessment &  Plan:  Well exam. We discussed diet and exercise. Get fasting labs.  Gershon Crane, MD

## 2023-02-03 LAB — HEPATIC FUNCTION PANEL
ALT: 15 U/L (ref 0–53)
AST: 22 U/L (ref 0–37)
Albumin: 4.7 g/dL (ref 3.5–5.2)
Alkaline Phosphatase: 44 U/L (ref 39–117)
Bilirubin, Direct: 0.3 mg/dL (ref 0.0–0.3)
Total Bilirubin: 1.6 mg/dL — ABNORMAL HIGH (ref 0.2–1.2)
Total Protein: 7.2 g/dL (ref 6.0–8.3)

## 2023-02-03 LAB — LIPID PANEL
Cholesterol: 150 mg/dL (ref 0–200)
HDL: 56.4 mg/dL (ref >39.00)
LDL Cholesterol: 76 mg/dL (ref 0–99)
NonHDL: 93.19
Total CHOL/HDL Ratio: 3
Triglycerides: 86 mg/dL (ref 0.0–149.0)
VLDL: 17.2 mg/dL (ref 0.0–40.0)

## 2023-02-03 LAB — BASIC METABOLIC PANEL
BUN: 17 mg/dL (ref 6–23)
CO2: 25 meq/L (ref 19–32)
Calcium: 9.7 mg/dL (ref 8.4–10.5)
Chloride: 100 meq/L (ref 96–112)
Creatinine, Ser: 1.19 mg/dL (ref 0.40–1.50)
GFR: 64.55 mL/min (ref >60.00)
Glucose, Bld: 86 mg/dL (ref 70–99)
Potassium: 3.6 meq/L (ref 3.5–5.1)
Sodium: 138 meq/L (ref 135–145)

## 2023-02-04 ENCOUNTER — Encounter: Payer: 59 | Admitting: Family Medicine

## 2023-03-02 ENCOUNTER — Encounter: Payer: Self-pay | Admitting: Podiatry

## 2023-03-16 ENCOUNTER — Encounter: Payer: Self-pay | Admitting: Family Medicine

## 2023-03-17 MED ORDER — NIRMATRELVIR/RITONAVIR (PAXLOVID)TABLET: 3.0000 | ORAL_TABLET | Freq: Two times a day (BID) | ORAL | 0 refills | Status: AC

## 2023-03-17 NOTE — Telephone Encounter (Signed)
I sent in a RX for Paxlovid

## 2023-03-19 ENCOUNTER — Telehealth: Payer: Self-pay

## 2023-03-19 NOTE — Telephone Encounter (Signed)
 Copied from CRM [id number]. Topic: Clinical - Medication Question >> Mar 17, 2023  8:50 AM Joanell B wrote: Reason for CRM: Pt is requesting a callback after sending a message through his MyChart  regarding getting a Paxlovid  prescription just in case he gets sick while away.

## 2023-03-20 NOTE — Telephone Encounter (Signed)
 Rx sent on 03/17/23. Pt notified via MyChart

## 2023-03-20 NOTE — Telephone Encounter (Signed)
 Left detailed message regarding this message on pt voicemail

## 2023-04-06 ENCOUNTER — Other Ambulatory Visit: Payer: Self-pay | Admitting: Family Medicine

## 2023-04-06 DIAGNOSIS — E039 Hypothyroidism, unspecified: Secondary | ICD-10-CM

## 2023-04-08 ENCOUNTER — Encounter: Payer: Self-pay | Admitting: Family Medicine

## 2023-05-04 ENCOUNTER — Ambulatory Visit: Payer: 59 | Admitting: Family

## 2023-05-04 VITALS — BP 136/87 | HR 78 | Temp 97.5°F | Ht 73.5 in | Wt 212.2 lb

## 2023-05-04 DIAGNOSIS — L0591 Pilonidal cyst without abscess: Secondary | ICD-10-CM

## 2023-05-04 DIAGNOSIS — K6289 Other specified diseases of anus and rectum: Secondary | ICD-10-CM | POA: Diagnosis not present

## 2023-05-04 MED ORDER — LIDOCAINE-HYDROCORTISONE ACE 3-2.5 % RE KIT: PACK | RECTAL | 0 refills | Status: DC

## 2023-05-04 MED ORDER — MUPIROCIN 2 % EX OINT
1.0000 | TOPICAL_OINTMENT | Freq: Two times a day (BID) | CUTANEOUS | 0 refills | Status: DC
Start: 2023-05-04 — End: 2023-08-27

## 2023-05-04 NOTE — Progress Notes (Signed)
Patient ID: Samuel Castillo, male    DOB: 10/02/1960, 63 y.o.   MRN: [id number]  Chief Complaint  Patient presents with   Rectal Pain    3-4 days, burning sensation bump/knot        Discussed the use of AI scribe software for clinical note transcription with the patient, who gave verbal consent to proceed.  History of Present Illness   Samuel Castillo "Samuel Castillo" is a 63 year old male who presents with anal discomfort and burning sensation. He has been experiencing a burning sensation and tenderness in the anal region for the past three to four days. Over-the-counter creams, including hydrocortisone and triamcinolone, have not provided relief and may have worsened the symptoms. The sensation is described as more burning than itching, although there was some itching earlier in the week. No bleeding is noted, either visible or on toilet paper, and bowel movements are regular and soft, without straining. He mentions a history of a recurring issue at the top of his buttock crack, described as an open area that drains minimally, sometimes leaving marks on his underwear. This area becomes irritated during showers but feels better afterward. Triamcinolone has been used on this area in the past when it becomes bothersome, but states this made the pain worse. He recalls having this issue for years and had it evaluated once due to bleeding during physical activities like sit-ups. He had a colonoscopy approximately one to two years ago, which showed no concerns. He is concerned about the possibility of cancer due to his wife's history of vulvar cancer, but he has not noticed any symptoms that would suggest malignancy.     Assessment & Plan:     Anal pain - Likely internal hemorrhoid vs. fissure; with external skin changes, slight tear noted with tender nodule (not hemorrhoidal tissue),  Painful and burning sensation, not responsive to over-the-counter creams. No bleeding noted. -Start over-the-counter Preparation  H suppositories BID for a week or until symptoms improve, ok to use vaseline on end of suppository for easier insertion. -Sending Lidocaine cream to apply to rectum for pain relief bid. -Can also apply Vaseline or thin layer of antibiotic ointment for soothing effect to rectum. -If no improvement after 1w-10d, call the office.  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 No current signs of infection, but is open and draining minimal amount of serosanguinous drainage. -Keep area clean with soap and water. -Use dry gauze to absorb drainage and promote healing. -Ok to a thin layer of antibiotic ointment to the area after showering, sending RX. -Consider referral to a general surgeon for evaluation and possible surgical intervention if the issue persists.     Subjective:    Outpatient Medications Prior to Visit  Medication Sig Dispense Refill   levothyroxine (SYNTHROID) 75 MCG tablet TAKE 1 TABLET(75 MCG) BY MOUTH DAILY BEFORE BREAKFAST 90 tablet 3   rosuvastatin (CRESTOR) 10 MG tablet Take 1 tablet (10 mg total) by mouth daily. 90 tablet 3   No facility-administered medications prior to visit.   Past Medical History:  Diagnosis Date   Allergy    SLIGHT   Hepatitis B infection    resolved, no viral remnants    Hx of adenomatous colonic polyps 2014   Hyperlipidemia    Hypertension    BORDELINE,NO MEDICATIONS   Thyroid disease    Past Surgical History:  Procedure Laterality Date   COLONOSCOPY  10/23/2021  per Dr. Meridee Score, no polyps, int hemorrhoids,  repeat in 7 yrs   POLYPECTOMY     TONSILLECTOMY  1970   No Known Allergies    Objective:    Physical Exam Vitals and nursing note reviewed.  Constitutional:      General: He is not in acute distress.    Appearance: Normal appearance.  HENT:     Head: Normocephalic.  Cardiovascular:     Rate and Rhythm: Normal rate and regular rhythm.  Pulmonary:     Effort: Pulmonary  effort is normal.     Breath sounds: Normal breath sounds.  Musculoskeletal:        General: Normal range of motion.     Cervical back: Normal range of motion.  Skin:    General: Skin is warm and dry.          Comments: slight skin tear at top of rectum, flesh colored, slightly tender nodule noted on lower rectum  Neurological:     Mental Status: He is alert and oriented to person, place, and time.  Psychiatric:        Mood and Affect: Mood normal.    BP 136/87 (BP Location: Right Arm, Patient Position: Sitting, Cuff Size: Normal)   Pulse 78   Temp (!) 97.5 F (36.4 C) (Oral)   Ht 6' 1.5" (1.867 m)   Wt 212 lb 3.2 oz (96.3 kg)   SpO2 97%   BMI 27.62 kg/m  Wt Readings from Last 3 Encounters:  05/04/23 212 lb 3.2 oz (96.3 kg)  02/02/23 208 lb (94.3 kg)  01/29/22 214 lb 4 oz (97.2 kg)       Dulce Sellar, NP

## 2023-05-07 ENCOUNTER — Encounter: Payer: Self-pay | Admitting: Family Medicine

## 2023-05-07 ENCOUNTER — Ambulatory Visit: Payer: 59 | Admitting: Family Medicine

## 2023-05-07 VITALS — BP 118/80 | HR 80 | Temp 98.2°F | Wt 212.0 lb

## 2023-05-07 DIAGNOSIS — K6289 Other specified diseases of anus and rectum: Secondary | ICD-10-CM | POA: Diagnosis not present

## 2023-05-07 DIAGNOSIS — N5089 Other specified disorders of the male genital organs: Secondary | ICD-10-CM | POA: Diagnosis not present

## 2023-05-07 DIAGNOSIS — B009 Herpesviral infection, unspecified: Secondary | ICD-10-CM | POA: Diagnosis not present

## 2023-05-07 MED ORDER — HYDROCORTISONE ACETATE 25 MG RE SUPP
25.0000 mg | Freq: Two times a day (BID) | RECTAL | 0 refills | Status: AC
Start: 2023-05-07 — End: ?

## 2023-05-07 MED ORDER — VALACYCLOVIR HCL 1 G PO TABS: 1000.0000 mg | ORAL_TABLET | Freq: Every day | ORAL | 3 refills | Status: DC

## 2023-05-07 NOTE — Progress Notes (Signed)
   Subjective:    Patient ID: Samuel Castillo, male    DOB: [birth year], 63 y.o.   MRN: [id number]  HPI Here to follow up a clinic visit on 05-04-23 when he presented with 4 days of burning pain in the anal area. He says he has had this type of pain in the past off an on. He also has an area of skin between the buttocks that is sore and drains clear fluid off and on for the past year. In addition he frequently sees a small amount of blood on the toilet paper when he wipes after a BM. There is no pain with passing stools. At the visit above he was given Mupiricin ointment to apply to the intergluteal fold, and he was told the use Preparation H suppositories. He has been doing this for the past 3 days, and each area feels a little better. In addition to all this. 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 He was negative for HSV 2. He has never had a breakout to his knowledge. He does have sex with men and women.    Review of Systems  Constitutional: Negative.   Respiratory: Negative.    Cardiovascular: Negative.   Gastrointestinal:  Positive for rectal pain.  Skin:  Positive for rash.       Objective:   Physical Exam Constitutional:      Appearance: Normal appearance.  Cardiovascular:     Rate and Rhythm: Normal rate and regular rhythm.     Pulses: Normal pulses.     Heart sounds: Normal heart sounds.  Pulmonary:     Effort: Pulmonary effort is normal.     Breath sounds: Normal breath sounds.  Genitourinary:    Comments: There is a 2 cm vertical fissure in the upper intergluteal fold which is tender. The anal area is tender and has a few tiny macular red spots Neurological:     Mental Status: He is alert.           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 He will stop the Preparation H suppositories and instead he will use 6  days of Hydrocortisone suppositories. He will continue to apply Mupiricin ointment to the fissure. He will wipe the area very gently after BM's. He will use Dove soap to clean the area rather than shower gels. He will also start taking Valtrex 1000 mg daily for maintenance. Recheck as needed. Gershon Crane, MD

## 2023-08-12 ENCOUNTER — Encounter: Payer: Self-pay | Admitting: Family Medicine

## 2023-08-12 ENCOUNTER — Ambulatory Visit (INDEPENDENT_AMBULATORY_CARE_PROVIDER_SITE_OTHER): Admitting: Family Medicine

## 2023-08-12 VITALS — BP 124/76 | HR 92 | Temp 98.2°F | Wt 204.0 lb

## 2023-08-12 DIAGNOSIS — E039 Hypothyroidism, unspecified: Secondary | ICD-10-CM | POA: Diagnosis not present

## 2023-08-12 DIAGNOSIS — E860 Dehydration: Secondary | ICD-10-CM | POA: Diagnosis not present

## 2023-08-12 DIAGNOSIS — R739 Hyperglycemia, unspecified: Secondary | ICD-10-CM

## 2023-08-12 NOTE — Progress Notes (Signed)
   Subjective:    Patient ID: Samuel Castillo, male    DOB: [birth year], 63 y.o.   MRN: [id number]  HPI Here for 2 and 1/2 weeks of intermittent weakness, lightheadedness, nausea without vomiting, blurry vision, and lower abdominal cramping. No recent travel. No recent medication changes. This began with 24 hours of abdominal cramps and diarrhea, but the diarrhea resolved quickly.    Review of Systems  Constitutional:  Positive for fatigue. Negative for chills, diaphoresis and fever.  HENT: Negative.    Eyes:  Positive for visual disturbance.  Respiratory: Negative.    Cardiovascular: Negative.   Gastrointestinal:  Positive for abdominal pain and nausea. Negative for abdominal distention, blood in stool, constipation, diarrhea and vomiting.  Genitourinary: Negative.   Musculoskeletal: Negative.   Neurological:  Positive for light-headedness. Negative for dizziness and headaches.       Objective:   Physical Exam Constitutional:      Appearance: Normal appearance. He is not ill-appearing.  HENT:     Right Ear: Tympanic membrane, ear canal and external ear normal.     Left Ear: Tympanic membrane, ear canal and external ear normal.     Nose: Nose normal.     Mouth/Throat:     Pharynx: Oropharynx is clear.  Eyes:     Conjunctiva/sclera: Conjunctivae normal.     Pupils: Pupils are equal, round, and reactive to light.  Cardiovascular:     Rate and Rhythm: Normal rate and regular rhythm.     Pulses: Normal pulses.     Heart sounds: Normal heart sounds.  Pulmonary:     Effort: Pulmonary effort is normal.     Breath sounds: Normal breath sounds.  Abdominal:     General: Abdomen is flat. Bowel sounds are normal. There is no distension.     Palpations: Abdomen is soft. There is no mass.     Tenderness: There is no abdominal tenderness. There is no right CVA tenderness, left CVA tenderness, guarding or rebound.     Hernia: No hernia is present.  Neurological:     Mental Status: He is  alert.           Assessment & Plan:  He has had symptoms consistent with dehydration after a GI virus. I advised him to drink plenty of water and Gatorade. We will get labs today. He will follw up if anything changes.  Corita Diego, MD

## 2023-08-13 LAB — HEMOGLOBIN A1C: Hgb A1c MFr Bld: 5.5 % (ref 4.6–6.5)

## 2023-08-13 LAB — CBC WITH DIFFERENTIAL/PLATELET
Basophils Absolute: 0.1 10*3/uL (ref 0.0–0.1)
Basophils Relative: 1.1 % (ref 0.0–3.0)
Eosinophils Absolute: 0.3 10*3/uL (ref 0.0–0.7)
Eosinophils Relative: 3 % (ref 0.0–5.0)
HCT: 46.3 % (ref 39.0–52.0)
Hemoglobin: 16 g/dL (ref 13.0–17.0)
Lymphocytes Relative: 12 % (ref 12.0–46.0)
Lymphs Abs: 1.2 10*3/uL (ref 0.7–4.0)
MCHC: 34.5 g/dL (ref 30.0–36.0)
MCV: 87.5 fl (ref 78.0–100.0)
Monocytes Absolute: 0.6 10*3/uL (ref 0.1–1.0)
Monocytes Relative: 6.5 % (ref 3.0–12.0)
Neutro Abs: 7.6 10*3/uL (ref 1.4–7.7)
Neutrophils Relative %: 77.4 % — ABNORMAL HIGH (ref 43.0–77.0)
Platelets: 189 10*3/uL (ref 150.0–400.0)
RBC: 5.29 Mil/uL (ref 4.22–5.81)
RDW: 13.3 % (ref 11.5–15.5)
WBC: 9.8 10*3/uL (ref 4.0–10.5)

## 2023-08-13 LAB — HEPATIC FUNCTION PANEL
ALT: 17 U/L (ref 0–53)
AST: 21 U/L (ref 0–37)
Albumin: 4.8 g/dL (ref 3.5–5.2)
Alkaline Phosphatase: 42 U/L (ref 39–117)
Bilirubin, Direct: 0.2 mg/dL (ref 0.0–0.3)
Total Bilirubin: 0.9 mg/dL (ref 0.2–1.2)
Total Protein: 7.4 g/dL (ref 6.0–8.3)

## 2023-08-13 LAB — BASIC METABOLIC PANEL WITH GFR
BUN: 20 mg/dL (ref 6–23)
CO2: 30 meq/L (ref 19–32)
Calcium: 9.9 mg/dL (ref 8.4–10.5)
Chloride: 101 meq/L (ref 96–112)
Creatinine, Ser: 1.27 mg/dL (ref 0.40–1.50)
GFR: 59.48 mL/min — ABNORMAL LOW (ref >60.00)
Glucose, Bld: 95 mg/dL (ref 70–99)
Potassium: 3.8 meq/L (ref 3.5–5.1)
Sodium: 139 meq/L (ref 135–145)

## 2023-08-13 LAB — T3, FREE: T3, Free: 3 pg/mL (ref 2.3–4.2)

## 2023-08-13 LAB — TSH: TSH: 1.15 u[IU]/mL (ref 0.35–5.50)

## 2023-08-13 LAB — T4, FREE: Free T4: 0.83 ng/dL (ref 0.60–1.60)

## 2023-08-13 LAB — MAGNESIUM: Magnesium: 2.1 mg/dL (ref 1.5–2.5)

## 2023-08-14 ENCOUNTER — Encounter: Payer: Self-pay | Admitting: Family Medicine

## 2023-08-17 ENCOUNTER — Ambulatory Visit: Payer: Self-pay | Admitting: Family Medicine

## 2023-08-27 ENCOUNTER — Encounter: Payer: Self-pay | Admitting: Family Medicine

## 2023-08-27 ENCOUNTER — Other Ambulatory Visit: Payer: Self-pay

## 2023-08-27 DIAGNOSIS — L0591 Pilonidal cyst without abscess: Secondary | ICD-10-CM

## 2023-08-27 DIAGNOSIS — K6289 Other specified diseases of anus and rectum: Secondary | ICD-10-CM

## 2023-08-27 MED ORDER — MUPIROCIN 2 % EX OINT: 1.0000 | TOPICAL_OINTMENT | Freq: Two times a day (BID) | CUTANEOUS | 0 refills | Status: AC

## 2023-11-05 ENCOUNTER — Encounter: Payer: Self-pay | Admitting: Family Medicine

## 2023-11-06 MED ORDER — SILDENAFIL CITRATE 100 MG PO TABS: 100.0000 mg | ORAL_TABLET | Freq: Every day | ORAL | 5 refills | Status: AC | PRN

## 2023-11-06 NOTE — Telephone Encounter (Signed)
I sent in the RX  

## 2023-11-09 NOTE — Telephone Encounter (Signed)
 Please call the pharmacy and change this to 20 pills at a time

## 2023-11-10 ENCOUNTER — Other Ambulatory Visit: Payer: Self-pay

## 2023-11-27 ENCOUNTER — Encounter

## 2023-11-27 NOTE — Progress Notes (Signed)
 Subjective:   Samuel Castillo is a 63 y.o. who presents for a Medicare Wellness preventive visit.  As a reminder, Annual Wellness Visits don't include a physical exam, and some assessments may be limited, especially if this visit is performed virtually. We may recommend an in-person follow-up visit with your provider if needed.  Visit Complete: In person    Persons Participating in Visit: Patient.  AWV Questionnaire: No: Patient Medicare AWV questionnaire was not completed prior to this visit.        Objective:    Today's Vitals   There is no height or weight on file to calculate BMI.      No data to display          Current Medications (verified) Outpatient Encounter Medications as of 11/27/2023  Medication Sig   hydrocortisone  (ANUSOL -HC) 25 MG suppository Place 1 suppository (25 mg total) rectally 2 (two) times daily.   levothyroxine  (SYNTHROID ) 75 MCG tablet TAKE 1 TABLET(75 MCG) BY MOUTH DAILY BEFORE BREAKFAST   mupirocin  ointment (BACTROBAN ) 2 % Apply 1 Application topically 2 (two) times daily. Apply thin layer to open skin at top of buttock crack after showering. Keep area covered with dry gauze until no longer draining. Can apply to rectum bid for burning pain if needed.   rosuvastatin  (CRESTOR ) 10 MG tablet Take 1 tablet (10 mg total) by mouth daily.   sildenafil  (VIAGRA ) 100 MG tablet Take 1 tablet (100 mg total) by mouth daily as needed for erectile dysfunction.   valACYclovir  (VALTREX ) 1000 MG tablet Take 1 tablet (1,000 mg total) by mouth daily.   No facility-administered encounter medications on file as of 11/27/2023.    Allergies (verified) Patient has no known allergies.   History: Past Medical History:  Diagnosis Date   Allergy    SLIGHT   Hepatitis B infection    resolved, no viral remnants    Hx of adenomatous colonic polyps 2014   Hyperlipidemia    Hypertension    BORDELINE,NO MEDICATIONS   Thyroid  disease    Past Surgical History:   Procedure Laterality Date   COLONOSCOPY  10/23/2021   per Dr. Wilhelmenia, no polyps, int hemorrhoids,  repeat in 7 yrs   POLYPECTOMY     TONSILLECTOMY  1970   Family History  Problem Relation Age of Onset   Breast cancer Mother    Diabetes Father    Heart disease Father    Leukemia Sister    Healthy Sister    Healthy Sister    Healthy Brother    Colon cancer Neg Hx    Colon polyps Neg Hx    Crohn's disease Neg Hx    Esophageal cancer Neg Hx    Rectal cancer Neg Hx    Stomach cancer Neg Hx    Social History   Socioeconomic History   Marital status: Widowed    Spouse name: Not on file   Number of children: Not on file   Years of education: Not on file   Highest education level: Bachelor's degree (e.g., BA, AB, BS)  Occupational History   Not on file  Tobacco Use   Smoking status: Never    Passive exposure: Never   Smokeless tobacco: Never  Vaping Use   Vaping status: Never Used  Substance and Sexual Activity   Alcohol use: Yes    Comment: social   Drug use: Not Currently   Sexual activity: Not on file  Other Topics Concern   Not on file  Social History Narrative   Not on file   Social Drivers of Health   Financial Resource Strain: Low Risk  (11/27/2023)   Overall Financial Resource Strain (CARDIA)    Difficulty of Paying Living Expenses: Not hard at all  Food Insecurity: No Food Insecurity (11/27/2023)   Hunger Vital Sign    Worried About Running Out of Food in the Last Year: Never true    Ran Out of Food in the Last Year: Never true  Transportation Needs: No Transportation Needs (11/27/2023)   PRAPARE - Administrator, Civil Service (Medical): No    Lack of Transportation (Non-Medical): No  Physical Activity: Insufficiently Active (05/04/2023)   Exercise Vital Sign    Days of Exercise per Week: 4 days    Minutes of Exercise per Session: 30 min  Stress: No Stress Concern Present (11/27/2023)   [employer] of Occupational Health -  Occupational Stress Questionnaire    Feeling of Stress: Not at all  Social Connections: Moderately Integrated (05/04/2023)   Social Connection and Isolation Panel    Frequency of Communication with Friends and Family: Three times a week    Frequency of Social Gatherings with Friends and Family: Twice a week    Attends Religious Services: More than 4 times per year    Active Member of [employer] or Organizations: Yes    Attends Banker Meetings: More than 4 times per year    Marital Status: Widowed    Tobacco Counseling Counseling given: Not Answered    Clinical Intake:              Lab Results  Component Value Date   HGBA1C 5.5 08/12/2023   HGBA1C 5.6 02/02/2023   HGBA1C 5.7 01/29/2022               Activities of Daily Living      No data to display          Patient Care Team: Johnny Garnette LABOR, MD as PCP - General (Family Medicine)  I have updated your Care Teams any recent Medical Services you may have received from other providers in the past year.     Assessment:   This is a routine wellness examination for Samuel Castillo.  Hearing/Vision screen No results found.   Goals Addressed   None    Depression Screen     11/27/2023    1:44 PM 05/07/2023   11:10 AM 01/15/2022    2:16 PM 01/10/2021    8:54 AM 02/17/2019    9:36 AM 12/04/2017    1:41 PM  PHQ 2/9 Scores  PHQ - 2 Score 0 0 2 1 0 0  PHQ- 9 Score  3 4 2       Fall Risk     05/07/2023   11:10 AM 01/10/2021    8:55 AM 12/04/2017    1:41 PM  Fall Risk   Falls in the past year? 0 0 No   Number falls in past yr: 0 0   Injury with Fall? 0 0   Risk for fall due to : No Fall Risks No Fall Risks   Follow up Falls evaluation completed       Data saved with a previous flowsheet row definition    MEDICARE RISK AT HOME:     TIMED UP AND GO:  Was the test performed?  Yes  Length of time to ambulate 10 feet: 10 sec   Cognitive Function:  Immunizations Immunization History   Administered Date(s) Administered   Influenza, Seasonal, Injecte, Preservative Fre 11/28/2022   Influenza,inj,Quad PF,6+ Mos 01/06/2017, 12/04/2017, 12/14/2018, 02/21/2020, 01/10/2021, 12/27/2021   Moderna Covid-19 Vaccine Bivalent Booster 40yrs & up 01/06/2022   Moderna Sars-Covid-2 Vaccination 05/01/2019, 05/29/2019, 03/07/2020   Pfizer(Comirnaty )Fall Seasonal Vaccine 12 years and older 01/06/2022, 12/04/2022   Td 05/17/2007   Tdap 09/30/2021   Zoster Recombinant(Shingrix ) 12/14/2018, 02/07/2019    Screening Tests Health Maintenance  Topic Date Due   Medicare Annual Wellness (AWV)  Never done   HIV Screening  Never done   Hepatitis C Screening  Never done   Pneumococcal Vaccine: 50+ Years (1 of 1 - PCV) Never done   Influenza Vaccine  10/16/2023   COVID-19 Vaccine (6 - 2024-25 season) 11/16/2023   Colonoscopy  10/23/2028   DTaP/Tdap/Td (3 - Td or Tdap) 10/01/2031   Zoster Vaccines- Shingrix   Completed   Hepatitis B Vaccines 19-59 Average Risk  Aged Out   HPV VACCINES  Aged Out   Meningococcal B Vaccine  Aged Out    Health Maintenance Items Addressed:   Additional Screening:  Vision Screening: Recommended annual ophthalmology exams for early detection of glaucoma and other disorders of the eye. Is the patient up to date with their annual eye exam?   Who is the provider or what is the name of the office in which the patient attends annual eye exams?   Dental Screening: Recommended annual dental exams for proper oral hygiene  Community Resource Referral / Chronic Care Management: CRR required this visit?    CCM required this visit?     Plan:    I have personally reviewed and noted the following in the patient's chart:   Medical and social history Use of alcohol, tobacco or illicit drugs  Current medications and supplements including opioid prescriptions.  Functional ability and status Nutritional status Physical activity Advanced directives List of other  physicians Hospitalizations, surgeries, and ER visits in previous 12 months Vitals Screenings to include cognitive, depression, and falls Referrals and appointments  In addition, I have reviewed and discussed with patient certain preventive protocols, quality metrics, and best practice recommendations. A written personalized care plan for preventive services as well as general preventive health recommendations were provided to patient.   Rojelio LELON Blush, LPN   [id number]   After Visit Summary:  Notes: This encounter was created in error - please disregard. Patient rescheduled for a Welcome to Medicare visit with provider.

## 2023-11-27 NOTE — Patient Instructions (Addendum)
 Samuel Castillo,  Thank you for taking the time for your Medicare Wellness Visit. I appreciate your continued commitment to your health goals. Please review the care plan we discussed, and feel free to reach out if I can assist you further.  Medicare recommends these wellness visits once per year to help you and your care team stay ahead of potential health issues. These visits are designed to focus on prevention, allowing your provider to concentrate on managing your acute and chronic conditions during your regular appointments.  Please note that Annual Wellness Visits do not include a physical exam. Some assessments may be limited, especially if the visit was conducted virtually. If needed, we may recommend a separate in-person follow-up with your provider.  Ongoing Care Seeing your primary care provider every 3 to 6 months helps us  monitor your health and provide consistent, personalized care.   Referrals If a referral was made during today's visit and you haven't received any updates within two weeks, please contact the referred provider directly to check on the status.  Recommended Screenings:  Health Maintenance  Topic Date Due   Medicare Annual Wellness Visit  Never done   HIV Screening  Never done   Hepatitis C Screening  Never done   Pneumococcal Vaccine for age over 77 (1 of 1 - PCV) Never done   Flu Shot  10/16/2023   COVID-19 Vaccine (6 - 2024-25 season) 11/16/2023   Colon Cancer Screening  10/23/2028   DTaP/Tdap/Td vaccine (3 - Td or Tdap) 10/01/2031   Zoster (Shingles) Vaccine  Completed   Hepatitis B Vaccine  Aged Out   HPV Vaccine  Aged Out   Meningitis B Vaccine  Aged Out        No data to display         Advance Care Planning is important because it: Ensures you receive medical care that aligns with your values, goals, and preferences. Provides guidance to your family and loved ones, reducing the emotional burden of decision-making during critical moments.  Vision:  Annual vision screenings are recommended for early detection of glaucoma, cataracts, and diabetic retinopathy. These exams can also reveal signs of chronic conditions such as diabetes and high blood pressure  Dental: Annual dental screenings help detect early signs of oral cancer, gum disease, and other conditions linked to overall health, including heart disease and diabetes.  Please see the attached documents for additional preventive care recommendations.

## 2023-11-30 ENCOUNTER — Ambulatory Visit (INDEPENDENT_AMBULATORY_CARE_PROVIDER_SITE_OTHER)

## 2023-11-30 ENCOUNTER — Encounter: Payer: Self-pay | Admitting: Podiatry

## 2023-11-30 ENCOUNTER — Ambulatory Visit (INDEPENDENT_AMBULATORY_CARE_PROVIDER_SITE_OTHER): Admitting: Podiatry

## 2023-11-30 VITALS — Ht 73.5 in | Wt 204.0 lb

## 2023-11-30 DIAGNOSIS — M7752 Other enthesopathy of left foot: Secondary | ICD-10-CM | POA: Diagnosis not present

## 2023-11-30 DIAGNOSIS — M7751 Other enthesopathy of right foot: Secondary | ICD-10-CM

## 2023-12-01 ENCOUNTER — Ambulatory Visit: Payer: PRIVATE HEALTH INSURANCE | Admitting: Family Medicine

## 2023-12-01 ENCOUNTER — Encounter: Payer: Self-pay | Admitting: Family Medicine

## 2023-12-01 VITALS — BP 102/68 | HR 99 | Temp 98.0°F | Ht 73.0 in | Wt 194.4 lb

## 2023-12-01 DIAGNOSIS — N401 Enlarged prostate with lower urinary tract symptoms: Secondary | ICD-10-CM | POA: Diagnosis not present

## 2023-12-01 DIAGNOSIS — E039 Hypothyroidism, unspecified: Secondary | ICD-10-CM

## 2023-12-01 DIAGNOSIS — N138 Other obstructive and reflux uropathy: Secondary | ICD-10-CM

## 2023-12-01 DIAGNOSIS — H6191 Disorder of right external ear, unspecified: Secondary | ICD-10-CM | POA: Diagnosis not present

## 2023-12-01 DIAGNOSIS — R21 Rash and other nonspecific skin eruption: Secondary | ICD-10-CM | POA: Diagnosis not present

## 2023-12-01 DIAGNOSIS — E785 Hyperlipidemia, unspecified: Secondary | ICD-10-CM | POA: Diagnosis not present

## 2023-12-01 DIAGNOSIS — Z23 Encounter for immunization: Secondary | ICD-10-CM | POA: Diagnosis not present

## 2023-12-01 NOTE — Progress Notes (Signed)
 Subjective:    Patient ID: Samuel Castillo, male    DOB: [birth year], 63 y.o.   MRN: [id number]  HPI Here to follow up on issues. He feels well in general. He asks to check a spot on his right ear lobe that appeared about a year ago and it sometimes gets irritated. He also has an area around the anus that stays itchy and sore. He has been applying Mupiricin ointment to it, but this does not help. He has been taking Rosuvastatin  for about 5 years, and he asks if he can stop it. He is eating a much healthier diet than before and he has lost some weight.    Review of Systems  Constitutional: Negative.   HENT: Negative.    Eyes: Negative.   Respiratory: Negative.    Cardiovascular: Negative.   Gastrointestinal: Negative.   Genitourinary: Negative.   Musculoskeletal: Negative.   Skin:  Positive for rash.  Neurological: Negative.   Psychiatric/Behavioral: Negative.         Objective:   Physical Exam Constitutional:      General: He is not in acute distress.    Appearance: Normal appearance. He is well-developed. He is not diaphoretic.  HENT:     Head: Normocephalic and atraumatic.     Right Ear: Tympanic membrane and ear canal normal.     Left Ear: External ear normal.     Ears:     Comments: There is a firm pearly colored papular lesion on the right tragus     Nose: Nose normal.     Mouth/Throat:     Pharynx: No oropharyngeal exudate.  Eyes:     General: No scleral icterus.       Right eye: No discharge.        Left eye: No discharge.     Conjunctiva/sclera: Conjunctivae normal.     Pupils: Pupils are equal, round, and reactive to light.  Neck:     Thyroid : No thyromegaly.     Vascular: No JVD.     Trachea: No tracheal deviation.  Cardiovascular:     Rate and Rhythm: Normal rate and regular rhythm.     Pulses: Normal pulses.     Heart sounds: Normal heart sounds. No murmur heard.    No friction rub. No gallop.  Pulmonary:     Effort: Pulmonary effort is normal. No  respiratory distress.     Breath sounds: Normal breath sounds. No wheezing or rales.  Chest:     Chest wall: No tenderness.  Abdominal:     General: Bowel sounds are normal. There is no distension.     Palpations: Abdomen is soft. There is no mass.     Tenderness: There is no abdominal tenderness. There is no guarding or rebound.  Genitourinary:    Penis: Normal. No tenderness.      Testes: Normal.     Prostate: Normal.     Rectum: Guaiac result negative.     Comments: The perineal and perianal areas have a macular erythematous rash  Musculoskeletal:        General: No tenderness. Normal range of motion.     Cervical back: Neck supple.  Lymphadenopathy:     Cervical: No cervical adenopathy.  Skin:    General: Skin is warm and dry.     Coloration: Skin is not pale.     Findings: No erythema or rash.  Neurological:     General: No focal deficit present.  Mental Status: He is alert and oriented to person, place, and time.     Cranial Nerves: No cranial nerve deficit.     Motor: No abnormal muscle tone.     Coordination: Coordination normal.     Deep Tendon Reflexes: Reflexes are normal and symmetric. Reflexes normal.  Psychiatric:        Mood and Affect: Mood normal.        Behavior: Behavior normal.        Thought Content: Thought content normal.        Judgment: Judgment normal.           Assessment & Plan:  His hypothyroidism is stable (he had levels checked in May). His perineal rash is consistent with psoriasis, so he wil stop the Mupiricin, and he will apply 1 % hydrocortisone  cream to it BID. We will refer him to Dermatology to evaluate this. He also has a lesion on the right earlobe that may be a basal cell cancer. We will have Dermatology evaluate this as well. We will check a lipid panel today for his dyslipidemia. We spent a total of ( 34  ) minutes reviewing records and discussing these issues.  Garnette Olmsted, MD

## 2023-12-01 NOTE — Progress Notes (Signed)
 This encounter was created in error - please disregard.

## 2023-12-01 NOTE — Addendum Note (Signed)
 Addended by: LADONNA INOCENTE SAILOR on: 12/01/2023 04:58 PM   Modules accepted: Orders

## 2023-12-01 NOTE — Progress Notes (Signed)
   Chief Complaint  Patient presents with   Foot Pain    Pt is here due to bilateral foot pain, states he has tingling and numbness sensation to both feet, with the right being the worse, states this has been going on for quite a while, was seeing another doctor here was not pleased with care. Interested in orthotics.    HPI: 63 y.o. male presenting today for follow-up evaluation and second opinion regarding numbness to the right forefoot.  He has been treated previously with Dr. Tobie here in our office.  Nerve conduction velocity studies were ordered negative for any large motor impingements.  Presenting for follow-up treatment evaluation  Past Medical History:  Diagnosis Date   Allergy    SLIGHT   Hepatitis B infection    resolved, no viral remnants    Hx of adenomatous colonic polyps 2014   Hyperlipidemia    Hypertension    BORDELINE,NO MEDICATIONS   Thyroid  disease     Past Surgical History:  Procedure Laterality Date   COLONOSCOPY  10/23/2021   per Dr. Wilhelmenia, no polyps, int hemorrhoids,  repeat in 7 yrs   POLYPECTOMY     TONSILLECTOMY  1970    No Known Allergies   Physical Exam: General: The patient is alert and oriented x3 in no acute distress.  Dermatology: Skin is warm, dry and supple bilateral lower extremities.   Vascular: Palpable pedal pulses bilaterally. Capillary refill within normal limits.  No appreciable edema.  No erythema.  Neurological: Light touch and protective threshold grossly intact bilateral.  Paresthesia with numbness noted to the right forefoot  Musculoskeletal Exam: No pedal deformities noted  Radiographic Exam B/L feet 12/01/2023:  Normal osseous mineralization. Joint spaces preserved.  No fractures or osseous irregularities noted.  Impression: Negative  Assessment/Plan of Care: 1.  Paresthesia with persistent numbness right forefoot  -Patient evaluated -Patient has now had this numbness isolated to the forefoot for several years.   I do suspect that this is permanent and irreversible -Recommend conservative treatment and management to mitigate the symptoms including wide fitting shoes and arch supports to offload pressure from the forefoot -Refrain from going barefoot -Return to clinic PRN     Thresa EMERSON Sar, DPM [facility]  Dr. Thresa EMERSON Sar, DPM    [address] [place], [place] [address]                Office [phone]  Fax [phone]

## 2023-12-02 LAB — LIPID PANEL
Cholesterol: 148 mg/dL (ref 0–200)
HDL: 67.7 mg/dL (ref >39.00)
LDL Cholesterol: 57 mg/dL (ref 0–99)
NonHDL: 80.11
Total CHOL/HDL Ratio: 2
Triglycerides: 114 mg/dL (ref 0.0–149.0)
VLDL: 22.8 mg/dL (ref 0.0–40.0)

## 2023-12-02 LAB — PSA: PSA: 0.85 ng/mL (ref 0.10–4.00)

## 2023-12-02 LAB — C-REACTIVE PROTEIN: CRP: <1 mg/dL (ref 0.5–20.0)

## 2023-12-03 ENCOUNTER — Ambulatory Visit: Payer: Self-pay | Admitting: Family Medicine

## 2023-12-15 ENCOUNTER — Encounter: Payer: Self-pay | Admitting: Family Medicine

## 2023-12-16 ENCOUNTER — Encounter: Payer: Self-pay | Admitting: Podiatry

## 2023-12-17 ENCOUNTER — Telehealth: Payer: Self-pay

## 2023-12-17 ENCOUNTER — Ambulatory Visit: Payer: PRIVATE HEALTH INSURANCE | Admitting: Behavioral Health

## 2023-12-17 NOTE — Telephone Encounter (Signed)
 Patient called and LVM- he would like to order the same pair of orthotics that he got last year.

## 2023-12-21 ENCOUNTER — Ambulatory Visit: Payer: PRIVATE HEALTH INSURANCE | Admitting: Psychology

## 2023-12-29 NOTE — Telephone Encounter (Signed)
 Order placed for re-order of orthotics from [facility] same as he recvd last year  Need to bill insurance  Lolita Schultze

## 2023-12-30 ENCOUNTER — Telehealth: Payer: Self-pay

## 2023-12-30 NOTE — Telephone Encounter (Signed)
 Called to update patient orthotics have been reordered

## 2024-01-06 ENCOUNTER — Ambulatory Visit: Payer: PRIVATE HEALTH INSURANCE | Admitting: Licensed Clinical Social Worker

## 2024-01-11 ENCOUNTER — Telehealth: Payer: Self-pay

## 2024-01-11 DIAGNOSIS — M7751 Other enthesopathy of right foot: Secondary | ICD-10-CM

## 2024-01-11 DIAGNOSIS — M7752 Other enthesopathy of left foot: Secondary | ICD-10-CM

## 2024-01-11 NOTE — Telephone Encounter (Signed)
 Pt has been notified and can pu no appt needed (financial form needs to be signed)

## 2024-01-11 NOTE — Telephone Encounter (Signed)
 Orthotics are here- charges pending insurance

## 2024-02-01 ENCOUNTER — Ambulatory Visit: Payer: PRIVATE HEALTH INSURANCE | Admitting: Licensed Clinical Social Worker

## 2024-02-01 DIAGNOSIS — F4322 Adjustment disorder with anxiety: Secondary | ICD-10-CM | POA: Diagnosis not present

## 2024-02-01 NOTE — Progress Notes (Signed)
 Samuel Castillo Behavioral Health Counselor/Therapist Progress Note  Patient ID: Samuel Castillo, MRN: [id number]    Date: 02/01/24  Time Spent: 0900  am - 1003 am : 63 Minutes  Treatment Type: Individual Therapy.  Presenting Problem Chief Complaint: Patient reports that he lost his wife 3 years ago to cancer. He reports that he continues to struggle with grief and is very emotional. He reports that he is in a new relationship and he is unsure about if he wants to do this. Patient reports that his new partner is a man and he feels that this would cause issues.   What are the main stressors in your life right now, how long? Anxiety   3, Sleep Changes   3, Racing Thoughts   3, Confusion   3, Obsessive Thoughts   3, and Change in Sexual Interest   3   Previous mental health services Have you ever been treated for a mental health problem, when, where, by whom? No     Are you currently seeing a therapist or counselor, counselor's name? No   Have you ever had a mental health hospitalization, how many times, length of stay?  NO  Have you ever been treated with medication, name, reason, response? No   Have you ever had suicidal thoughts or attempted suicide, when, how? No   Risk factors for Suicide Demographic factors:  Male, Age 65 or older, Divorced or widowed, Caucasian, Samuel Castillo, lesbian, or bisexual orientation, Living alone, and Unemployed Current mental status: No plan to harm self or others Loss factors: Loss of significant relationship Historical factors: NA Risk Reduction factors: Sense of responsibility to family Clinical factors:  Severe Anxiety and/or Agitation Cognitive features that contribute to risk: NA   SUICIDE RISK:  Minimal: No identifiable suicidal ideation.  Patients presenting with no risk factors but with morbid ruminations; may be classified as minimal risk based on the severity of the depressive symptoms  Medical history Medical treatment and/or problems, explain: No  NA  Do you have any issues with chronic pain?  No  Name of primary care physician/last physical exam: Dr. Johnny Castillo  Allergies: No Medication, reactions? NA   Current medications:  evothyroxine (SYNTHROID ) 75 MCG tablet  Taking Taking Differently Not Taking Unknown             mupirocin  ointment (BACTROBAN ) 2 % 1 Application, 2 times daily Edit Taking         Patient Requested Removal   sildenafil  (VIAGRA ) 100 MG tablet          Prescribed by: Dr. Delight  Is there any history of mental health problems or substance abuse in your family, whom? No   Has anyone in your family been hospitalized, who, where, length of stay? No   Social/family history Have you been married, how many times?  1  Do you have children?  4  How many pregnancies have you had?  6 Pregnancies for wife  Who lives in your current household? Patient lives alone  Military history: No NA  Religious/spiritual involvement: [employer] are you? Catholic  Family of origin (childhood history Mom and Dad and patient and 5 siblings   Where were you born? [place]  Where did you grow up? [place]  How many different homes have you lived? 7  Describe the atmosphere of the household where you grew up: Stable, family oriented and loving but not over emotional.  Do you have siblings, step/half siblings, list names,  relation, sex, age? Yes  Joann-69, Karen-68, Bill-64, Nancy-61, Bob-56  Are your parents separated/divorced, when and why? No Parents remained married  Are your parents alive? No NA  Social supports (personal and professional): A good friend-work peer  Education How many grades have you completed? college graduate  Did you have any problems in school, what type? No  Medications prescribed for these problems? No   Employment (financial issues): Retired and denied finacial issues.   Legal history: Denied   Trauma/Abuse history: Have you ever been  exposed to any form of abuse, what type? No   Have you ever been exposed to something traumatic, describe? No   Substance use Do you use Caffeine? Yes Type, frequency? 1 cup of coffee  Do you use Nicotine? No Type, frequency, ppd? NA   Do you use Alcohol? Yes Type, frequency? 1-2 nights weekly 1-2 beers a night.  How old were you went you first tasted alcohol? 13  Was this accepted by your family? No  When was your last drink, type, how much? 5 days ago  Have you ever used illicit drugs or taken more than prescribed, type, frequency, date of last usage? Yes Marijuana  Mental Status: General Appearance Samuel Castillo:  Neat Eye Contact:  Good Motor Behavior:  Normal Speech:  Normal Level of Consciousness:  Alert Mood:  Anxious Affect:  Appropriate Anxiety Level:  Minimal Thought Process:  Coherent Thought Content:  WNL Perception:  Normal Judgment:  Good Insight:  Present Cognition:  Orientation time, place, and person  Diagnosis AXIS I Adjustment Disorder with Anxiety  AXIS II No diagnosis  AXIS III @[facility] @  AXIS IV problems related to social environment  AXIS V 61-70 mild symptoms   Risk Assessment: Danger to Self:  No Self-injurious Behavior: No Danger to Others: No Duty to Warn:no Physical Aggression / Violence:No  Access to Firearms a concern: No  Gang Involvement:No   Subjective:   Samuel Castillo participated in person from office, located at [employer]. Samuel Castillo consented to treatment. Therapist participated from office.   Interventions: Cognitive Behavioral Therapy and Motivational Interviewing  Diagnosis: Adjustment disorder with anxiety   Samuel Castillo MSW, LCSW/DATE 02/01/2024

## 2024-02-03 ENCOUNTER — Encounter: Payer: Self-pay | Admitting: Podiatry

## 2024-02-17 ENCOUNTER — Ambulatory Visit: Payer: PRIVATE HEALTH INSURANCE | Admitting: Licensed Clinical Social Worker

## 2024-02-17 DIAGNOSIS — F4322 Adjustment disorder with anxiety: Secondary | ICD-10-CM

## 2024-02-17 NOTE — Progress Notes (Signed)
 [facility] Counselor/Therapist Progress Note  Patient ID: Samuel Castillo, MRN: [id number]    Date: 02/17/24  Time Spent: 0405  pm - 0501 pm : 56 Minutes  Treatment Type: Individual Therapy.  Reported Symptoms: Patient reports that he lost his wife 3 years ago to cancer. He reports that he continues to struggle with grief and is very emotional. He reports that he is in a new relationship and he is unsure about if he wants to do this. Patient reports that his new partner is a man and he feels that this would cause issues.   Mental Status Exam: Appearance:  Neat     Behavior: Appropriate  Motor: Normal  Speech/Language:  Clear and Coherent  Affect: Appropriate  Mood: normal  Thought process: normal  Thought content:   WNL  Sensory/Perceptual disturbances:   WNL  Orientation: oriented to person, place, time/date, situation, day of week, month of year, and year  Attention: Good  Concentration: Good  Memory: WNL  Fund of knowledge:  Good  Insight:   Good  Judgment:  Good  Impulse Control: Good   Risk Assessment: Danger to Self:  No Self-injurious Behavior: No Danger to Others: No Duty to Warn:no Physical Aggression / Violence:No  Access to Firearms a concern: No  Gang Involvement:No   Subjective:   Samuel Castillo participated in person from office, located at [employer].  [name]  consented to treatment. Therapist participated from office.   [name] presented for his session stating that he had a good Thanksgiving celebrating with his children. [name] reports that he spent time with them and it was a good time. He reports that he still misses his wife and notices things that she would have done had she still been alive. He reports continuing to be emotional at times but not feeling he can freely identify that to his children. Patient states that he still has no intentions as of right now telling his children about his new partner. He states that at this time he doesn't  feel that it is necessary and his partner is quite comfortable the way things are. [name] states that [place] He states that he knows being active and busy are going to keep him healthy both mentally and physically. [name] reports a ski trip coming up during Christmas with his sons and he is looking forward to that. Patient is insightful and is self aware of his needs and continues to work toward building additional coping skills to cope with this period of life.  Clinician actively listened and  provided support and encouragement. Clinician and patient processed his concerns about his children and feeling that right now he is not ready to divulge any of his personal relationship with them. Patient pointed out that he is respectful of his wife and doesn't want to do anything that would be disrespectful to her in any way. Patient was tearful as he spoke of his wife and his feelings about her being gone. Clinician and patient processed how grief can become more intense around the holiday season.  [name] was fully engaged in session and was insightful in his feedback and open to new ideas and verbal feedback from Clinician. [name] will continue to engage in bi weekly CBT therapy and will use CBT, mindfulness and coping skills to help manage decrease symptoms associated with his diagnosis. Treatment planning to be reviewed by 01/31/2025.    Interventions: Cognitive Behavioral Therapy,  Dialectical Behavioral Therapy, Assertiveness/Communication, and Grief Therapy  Diagnosis: Adjustment Disorder with Anxiety    Samuel Castillo MSW, LCSW/DATE 02/17/2024

## 2024-03-28 ENCOUNTER — Ambulatory Visit: Payer: PRIVATE HEALTH INSURANCE | Admitting: Licensed Clinical Social Worker

## 2024-03-28 DIAGNOSIS — F4322 Adjustment disorder with anxiety: Secondary | ICD-10-CM | POA: Diagnosis not present

## 2024-03-28 NOTE — Progress Notes (Signed)
 [facility] Counselor/Therapist Progress Note  Patient ID: Samuel Castillo, MRN: [id number]    Date: 03/28/2024  Time Spent: 0900  am - 0958 am : 58 Minutes  Treatment Type: Individual Therapy.  Reported Symptoms: Patient reports that he lost his wife 3 years ago to cancer. He reports that he continues to struggle with grief and is very emotional. He reports that he is in a new relationship and he is unsure about if he wants to do this. Patient reports that his new partner is a man and he feels that this would cause issues.    Mental Status Exam: Appearance:  Neat     Behavior: Appropriate  Motor: Normal  Speech/Language:  Clear and Coherent  Affect: Appropriate  Mood: normal  Thought process: normal  Thought content:   WNL  Sensory/Perceptual disturbances:   WNL  Orientation: oriented to person, place, time/date, situation, day of week, month of year, and year  Attention: Good  Concentration: Good  Memory: WNL  Fund of knowledge:  Good  Insight:   Good  Judgment:  Good  Impulse Control: Good    Risk Assessment: Danger to Self:  No Self-injurious Behavior: No Danger to Others: No Duty to Warn:no Physical Aggression / Violence:No  Access to Firearms a concern: No  Gang Involvement:No    Subjective:    Samuel Castillo participated in person from office, located at [employer].  Samuel Castillo  consented to treatment. Therapist participated from office.    Samuel Castillo presented for his session in a positive mood. Samuel Castillo identified having a good holiday and spending time with his children. He reports that he went skiing with his sons in [place] . He reports ringing in the [place] Year in [place]  with his sons. Samuel Castillo reports that he is still seeing Krystal and the relationship is good and they both are aware of the need to not let patients children know. Samuel Castillo reports that he still struggles with loneliness. He reports that it's usually in the evening that it hits. He reports that he usually  tries to call friends or his siblings. He reports that helps. Patient was able to identify that he doesn't care what people think about him but how it will affect others in the process.  Clinician actively listened and processed with patient his concerns. Clinician provided a non judgmental environment for patient to share his emotions. Clinician identified that patient has a right to happiness. Clinician and patient processed how his decision can have a ripple effect. Clinician praised patient for his concern for others and how his choices will affect those he loves.   Samuel Castillo was fully engaged in session and was insightful in his feedback and open to new ideas and verbal feedback from Clinician. Samuel Castillo will continue to engage in bi weekly CBT therapy and will use CBT, mindfulness and coping skills to help manage decrease symptoms associated with his diagnosis. Treatment planning to be reviewed by 01/31/2025.     Interventions: Cognitive Behavioral Therapy, Dialectical Behavioral Therapy, Assertiveness/Communication, and Grief Therapy   Diagnosis: Adjustment Disorder with Anxiety    Samuel Castillo MSW, LCSW/DATE 03/28/2024

## 2024-04-05 ENCOUNTER — Other Ambulatory Visit: Payer: Self-pay | Admitting: Family Medicine

## 2024-04-05 DIAGNOSIS — E039 Hypothyroidism, unspecified: Secondary | ICD-10-CM

## 2024-04-18 ENCOUNTER — Ambulatory Visit: Admitting: Licensed Clinical Social Worker

## 2024-05-02 ENCOUNTER — Ambulatory Visit: Admitting: Licensed Clinical Social Worker
